# Patient Record
Sex: Female | Born: 2000 | Race: White | Hispanic: No | Marital: Single | Smoking: Never smoker
Health system: Southern US, Community
[De-identification: ages and names within clinical notes are randomized; demographics above are authoritative.]

## PROBLEM LIST (undated history)

## (undated) ENCOUNTER — Ambulatory Visit: Payer: Medicaid Other

## (undated) DIAGNOSIS — B338 Other specified viral diseases: Secondary | ICD-10-CM

## (undated) DIAGNOSIS — N390 Urinary tract infection, site not specified: Secondary | ICD-10-CM

## (undated) DIAGNOSIS — B974 Respiratory syncytial virus as the cause of diseases classified elsewhere: Secondary | ICD-10-CM

## (undated) HISTORY — PX: NO PAST SURGERIES: SHX2092

---
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
## 2022-01-12 DIAGNOSIS — Z419 Encounter for procedure for purposes other than remedying health state, unspecified: Secondary | ICD-10-CM | POA: Diagnosis not present

## 2022-01-14 DIAGNOSIS — A084 Viral intestinal infection, unspecified: Secondary | ICD-10-CM | POA: Diagnosis not present

## 2022-02-09 DIAGNOSIS — Z419 Encounter for procedure for purposes other than remedying health state, unspecified: Secondary | ICD-10-CM | POA: Diagnosis not present

## 2022-03-12 DIAGNOSIS — Z419 Encounter for procedure for purposes other than remedying health state, unspecified: Secondary | ICD-10-CM | POA: Diagnosis not present

## 2022-04-11 DIAGNOSIS — Z419 Encounter for procedure for purposes other than remedying health state, unspecified: Secondary | ICD-10-CM | POA: Diagnosis not present

## 2022-05-12 DIAGNOSIS — Z419 Encounter for procedure for purposes other than remedying health state, unspecified: Secondary | ICD-10-CM | POA: Diagnosis not present

## 2022-06-11 DIAGNOSIS — Z419 Encounter for procedure for purposes other than remedying health state, unspecified: Secondary | ICD-10-CM | POA: Diagnosis not present

## 2022-07-06 ENCOUNTER — Emergency Department: Payer: Medicaid Other

## 2022-07-06 ENCOUNTER — Emergency Department
Admission: EM | Admit: 2022-07-06 | Discharge: 2022-07-06 | Disposition: A | Discharge status: Home / Self Care | Payer: Medicaid Other | Attending: Emergency Medicine | Admitting: Emergency Medicine

## 2022-07-06 ENCOUNTER — Encounter: Payer: Self-pay | Admitting: Emergency Medicine

## 2022-07-06 DIAGNOSIS — R000 Tachycardia, unspecified: Secondary | ICD-10-CM | POA: Diagnosis not present

## 2022-07-06 DIAGNOSIS — R079 Chest pain, unspecified: Secondary | ICD-10-CM | POA: Diagnosis not present

## 2022-07-06 DIAGNOSIS — R0602 Shortness of breath: Secondary | ICD-10-CM | POA: Insufficient documentation

## 2022-07-06 DIAGNOSIS — Z8616 Personal history of COVID-19: Secondary | ICD-10-CM | POA: Diagnosis not present

## 2022-07-06 DIAGNOSIS — R002 Palpitations: Secondary | ICD-10-CM | POA: Diagnosis not present

## 2022-07-06 LAB — CBC
HCT: 36 % (ref 36.0–46.0)
Hemoglobin: 12.3 g/dL (ref 12.0–15.0)
MCH: 28.7 pg (ref 26.0–34.0)
MCHC: 34.2 g/dL (ref 30.0–36.0)
MCV: 84.1 fL (ref 80.0–100.0)
Platelets: 296 10*3/uL (ref 150–400)
RBC: 4.28 MIL/uL (ref 3.87–5.11)
RDW: 12.6 % (ref 11.5–15.5)
WBC: 12.1 10*3/uL — ABNORMAL HIGH (ref 4.0–10.5)
nRBC: 0 % (ref 0.0–0.2)

## 2022-07-06 LAB — BASIC METABOLIC PANEL
Anion gap: 5 (ref 5–15)
BUN: 11 mg/dL (ref 6–20)
CO2: 25 mmol/L (ref 22–32)
Calcium: 9.4 mg/dL (ref 8.9–10.3)
Chloride: 107 mmol/L (ref 98–111)
Creatinine, Ser: 0.96 mg/dL (ref 0.44–1.00)
GFR, Estimated: >60 mL/min (ref >60)
Glucose, Bld: 112 mg/dL — ABNORMAL HIGH (ref 70–99)
Potassium: 3.6 mmol/L (ref 3.5–5.1)
Sodium: 137 mmol/L (ref 135–145)

## 2022-07-06 LAB — TROPONIN I (HIGH SENSITIVITY)
Troponin I (High Sensitivity): <2 ng/L (ref <18)
Troponin I (High Sensitivity): <2 ng/L (ref <18)

## 2022-07-06 LAB — POC URINE PREG, ED: Preg Test, Ur: NEGATIVE

## 2022-07-06 NOTE — ED Notes (Signed)
C/O intermittent heart fluttering x 2 days.  States symptoms started while in [place] but were worse overnight.  Denies any aggravating or alleviating factors.  Denies current complaint.  AAOx3.  Skin warm and dry. NAD

## 2022-07-06 NOTE — ED Triage Notes (Signed)
Pt c/o intermittent episodes of heart palpitations x1 week with associated SOB with episodes. Pt denies cardiac hx.

## 2022-07-09 NOTE — ED Provider Notes (Signed)
[facility] Provider Note    Event Date/Time   First MD Initiated Contact with Patient 07/06/22 [phone]     (approximate)   History   Palpitations   HPI  Renee Silva is a 21 y.o. female  presents to the emergency department for treatment and evaluation of palpitations with associated shortness of breath for the past week. No chest pain. No specific trigger. Symptoms last various amounts of time. No cardiac history.  Past Medical History:  Diagnosis Date   History of 2019 novel coronavirus disease (COVID-19) 05/20/2019   RSV (respiratory syncytial virus infection)    UTI (urinary tract infection)      Physical Exam   Triage Vital Signs: ED Triage Vitals  Enc Vitals Group     BP 07/06/22 0129 115/69     Pulse Rate 07/06/22 0129 93     Resp 07/06/22 0129 16     Temp 07/06/22 0129 99.7 F (37.6 C)     Temp Source 07/06/22 0129 Oral     SpO2 07/06/22 0129 96 %     Weight --      Height --      Head Circumference --      Peak Flow --      Pain Score 07/06/22 0700 0     Pain Loc --      Pain Edu? --      Excl. in GC? --     Most recent vital signs: Vitals:   07/06/22 0353 07/06/22 0700  BP: (!) 105/54 122/73  Pulse: 84 83  Resp: 16 16  Temp: 98.5 F (36.9 C) 98 F (36.7 C)  SpO2: 100% 99%    General: Awake, no distress.  CV:  Good peripheral perfusion. Heart sounds regular. No M/R/G Resp:  Normal effort. Breath sounds clear to auscultation Abd:  No distention.  Other:     ED Results / Procedures / Treatments   Labs (all labs ordered are listed, but only abnormal results are displayed) Labs Reviewed  BASIC METABOLIC PANEL - Abnormal; Notable for the following components:      Result Value   Glucose, Bld 112 (*)    All other components within normal limits  CBC - Abnormal; Notable for the following components:   WBC 12.1 (*)    All other components within normal limits  POC URINE PREG, ED  TROPONIN I (HIGH SENSITIVITY)   TROPONIN I (HIGH SENSITIVITY)     EKG  Sinus tachycardia with a rate of 102 with occasional PVCs.   RADIOLOGY  Chest x-ray negative for acute cardiopulmonary abnormality.  I have independently reviewed and interpreted imaging as well as reviewed report from radiology.  PROCEDURES:  Critical Care performed: No  Procedures   MEDICATIONS ORDERED IN ED:  Medications - No data to display   IMPRESSION / MDM / ASSESSMENT AND PLAN / ED COURSE   I reviewed the triage vital signs and the nursing notes.  Differential diagnosis includes, but is not limited to: Cardiac dysrhythmia, anxiety, caffeine induced tachycardia  Patient's presentation is most consistent with acute illness / injury with system symptoms.  The patient is on the cardiac monitor to evaluate for evidence of arrhythmia and/or significant heart rate changes.  21 year old female presenting to the emergency department for treatment and evaluation of palpitations.  See HPI for further details.  Labs including serial troponins are all reassuring.  Chest x-ray is negative for acute concerns.  EKG does show a mild tachycardia  with occasional PVCs.  Occasional PVCs noted on bedside ECG monitoring.  Results reviewed with the patient and her mom.  Patient does consume energy drinks and some caffeine.  We discussed reducing and/or eliminating these beverages as it may be the underlying cause for the palpitations.  Plan will be to discharge her home with instructions to follow-up with cardiology especially if she feels palpitations after changing her caffeine intake.  If symptoms change or worsen or if she develops chest pain with associated symptoms she is to return to the emergency department.        FINAL CLINICAL IMPRESSION(S) / ED DIAGNOSES   Final diagnoses:  Palpitations     Rx / DC Orders   ED Discharge Orders          Ordered    Ambulatory referral to Cardiology       Comments: If you have not heard  from the Cardiology office within the next 72 hours please call [phone].   07/06/22 0734             Note:  This document was prepared using Dragon voice recognition software and may include unintentional dictation errors.   Chinita Pester, FNP 07/09/22 [phone]    Sharman Cheek, MD 07/12/22 [phone]

## 2022-07-11 ENCOUNTER — Encounter: Payer: Self-pay | Admitting: *Deleted

## 2022-07-12 DIAGNOSIS — Z419 Encounter for procedure for purposes other than remedying health state, unspecified: Secondary | ICD-10-CM | POA: Diagnosis not present

## 2022-07-18 ENCOUNTER — Ambulatory Visit (INDEPENDENT_AMBULATORY_CARE_PROVIDER_SITE_OTHER): Payer: Medicaid Other

## 2022-07-18 ENCOUNTER — Ambulatory Visit (INDEPENDENT_AMBULATORY_CARE_PROVIDER_SITE_OTHER): Payer: Medicaid Other | Admitting: Cardiology

## 2022-07-18 ENCOUNTER — Encounter: Payer: Self-pay | Admitting: Cardiology

## 2022-07-18 VITALS — BP 110/84 | HR 78 | Ht 63.0 in | Wt 204.0 lb

## 2022-07-18 DIAGNOSIS — R002 Palpitations: Secondary | ICD-10-CM | POA: Diagnosis not present

## 2022-07-18 DIAGNOSIS — Z68.41 Body mass index (BMI) pediatric, greater than or equal to 95th percentile for age: Secondary | ICD-10-CM

## 2022-07-18 DIAGNOSIS — Z6836 Body mass index (BMI) 36.0-36.9, adult: Secondary | ICD-10-CM

## 2022-07-18 NOTE — Progress Notes (Signed)
Cardiology Office Note:    Date:  07/18/2022   ID:  [name], DOB [birth year], MRN [id number]  PCP:  Linward Natal, NP [facility] Providers Cardiologist:  Debbe Odea, MD     Referring MD: Chinita Pester, FNP   Chief Complaint  Patient presents with   NEW patient-Evaluate palpitations    Symptoms began about 3 weeks ago and have improved since decreasing intake of caffienated drinks. (She was drinking energy drinks and soda daily.)    History of Present Illness:    Renee Silva is a 21 y.o. female with no significant past medical history who presents due to palpitations.  Palpitations began about 3 weeks ago.  She endorses drinking caffeinated soda and red bull occasionally.  Taken to the emergency room about 2 weeks ago due to symptoms.  Work-up in the ED was unrevealing, patient advised to cut back on caffeinated drinks and sodas.  She states symptoms have improved since cutting back on caffeine intake although she still occasionally has symptoms.  Denies any history of heart disease.     Past Medical History:  Diagnosis Date   History of 2019 novel coronavirus disease (COVID-19) 05/20/2019   RSV (respiratory syncytial virus infection)    UTI (urinary tract infection)     Past Surgical History:  Procedure Laterality Date   NO PAST SURGERIES      Current Medications: No outpatient medications have been marked as taking for the 07/18/22 encounter (Office Visit) with Debbe Odea, MD.     Allergies:   Patient has no known allergies.   Social History   Socioeconomic History   Marital status: Single    Spouse name: Not on file   Number of children: Not on file   Years of education: Not on file   Highest education level: Not on file  Occupational History   Not on file  Tobacco Use   Smoking status: Never   Smokeless tobacco: Never  Vaping Use   Vaping Use: Never used  Substance and Sexual Activity   Alcohol use: No   Drug use: No    Sexual activity: Never  Other Topics Concern   Not on file  Social History Narrative   Not on file   Social Determinants of Health   Financial Resource Strain: Not on file  Food Insecurity: Not on file  Transportation Needs: Not on file  Physical Activity: Not on file  Stress: Not on file  Social Connections: Not on file     Family History: The patient's family history includes Healthy in her father and mother.  ROS:   Please see the history of present illness.     All other systems reviewed and are negative.  EKGs/Labs/Other Studies Reviewed:    The following studies were reviewed today:   EKG:  EKG is  ordered today.  The ekg ordered today demonstrates normal sinus rhythm, low QRS voltage  Recent Labs: 07/06/2022: BUN 11; Creatinine, Ser 0.96; Hemoglobin 12.3; Platelets 296; Potassium 3.6; Sodium 137  Recent Lipid Panel No results found for: "CHOL", "TRIG", "HDL", "CHOLHDL", "VLDL", "LDLCALC", "LDLDIRECT"   Risk Assessment/Calculations:         Physical Exam:    VS:  BP 110/84 (BP Location: Right Arm, Patient Position: Sitting, Cuff Size: Large)   Pulse 78   Ht 5\' 3"  (1.6 m)   Wt 204 lb (92.5 kg)   SpO2 99%   BMI 36.14 kg/m     Wt Readings from  Last 3 Encounters:  07/18/22 204 lb (92.5 kg) (98 %, Z= 2.01)*  11/18/20 180 lb (81.6 kg) (96 %, Z= 1.72)*  06/23/20 186 lb (84.4 kg) (97 %, Z= 1.84)*   * Growth percentiles are based on CDC (Girls, 2-20 Years) data.     GEN:  Well nourished, well developed in no acute distress HEENT: Normal NECK: No JVD; No carotid bruits CARDIAC: RRR, no murmurs, rubs, gallops RESPIRATORY:  Clear to auscultation without rales, wheezing or rhonchi  ABDOMEN: Soft, non-tender, non-distended MUSCULOSKELETAL:  No edema; No deformity  SKIN: Warm and dry NEUROLOGIC:  Alert and oriented x 3 PSYCHIATRIC:  Normal affect   ASSESSMENT:    1. Palpitations   2. BMI 36.0-36.9,adult    PLAN:    In order of problems listed  above:  Palpitations, placed 2-week cardiac monitor to evaluate any significant arrhythmias.  Caffeine intake likely contributing since symptoms improved with cutting back. Overweight, low-calorie diet, weight loss advised.  Follow-up in 6 weeks     Medication Adjustments/Labs and Tests Ordered: Current medicines are reviewed at length with the patient today.  Concerns regarding medicines are outlined above.  Orders Placed This Encounter  Procedures   LONG TERM MONITOR (3-14 DAYS)   EKG 12-Lead   No orders of the defined types were placed in this encounter.   Patient Instructions  Medication Instructions:  Your physician recommends that you continue on your current medications as directed. Please refer to the Current Medication list given to you today.  *If you need a refill on your cardiac medications before your next appointment, please call your pharmacy*   Lab Work: None ordered If you have labs (blood work) drawn today and your tests are completely normal, you will receive your results only by: MyChart Message (if you have MyChart) OR A paper copy in the mail If you have any lab test that is abnormal or we need to change your treatment, we will call you to review the results.   Testing/Procedures: Your provider has ordered a heart monitor to wear for 14 days. This will be mailed to your home with instructions on placement. Once you have finished the time frame requested, you will return monitor in box provided.      Follow-Up: At [facility], you and your health needs are our priority.  As part of our continuing mission to provide you with exceptional heart care, we have created designated Provider Care Teams.  These Care Teams include your primary Cardiologist (physician) and Advanced Practice Providers (APPs -  Physician Assistants and Nurse Practitioners) who all work together to provide you with the care you need, when you need it.  We recommend signing up for  the patient portal called "MyChart".  Sign up information is provided on this After Visit Summary.  MyChart is used to connect with patients for Virtual Visits (Telemedicine).  Patients are able to view lab/test results, encounter notes, upcoming appointments, etc.  Non-urgent messages can be sent to your provider as well.   To learn more about what you can do with MyChart, go to [email or web address].    Your next appointment:   6 week(s)  The format for your next appointment:   In Person  Provider:   Debbe Odea, MD{    Other Instructions N/A  Important Information About Sugar         Signed, Debbe Odea, MD  07/18/2022 10:57 AM    [facility]

## 2022-07-18 NOTE — Patient Instructions (Signed)
Medication Instructions:  Your physician recommends that you continue on your current medications as directed. Please refer to the Current Medication list given to you today.  *If you need a refill on your cardiac medications before your next appointment, please call your pharmacy*   Lab Work: None ordered If you have labs (blood work) drawn today and your tests are completely normal, you will receive your results only by: MyChart Message (if you have MyChart) OR A paper copy in the mail If you have any lab test that is abnormal or we need to change your treatment, we will call you to review the results.   Testing/Procedures: Your provider has ordered a heart monitor to wear for 14 days. This will be mailed to your home with instructions on placement. Once you have finished the time frame requested, you will return monitor in box provided.      Follow-Up: At [facility], you and your health needs are our priority.  As part of our continuing mission to provide you with exceptional heart care, we have created designated Provider Care Teams.  These Care Teams include your primary Cardiologist (physician) and Advanced Practice Providers (APPs -  Physician Assistants and Nurse Practitioners) who all work together to provide you with the care you need, when you need it.  We recommend signing up for the patient portal called "MyChart".  Sign up information is provided on this After Visit Summary.  MyChart is used to connect with patients for Virtual Visits (Telemedicine).  Patients are able to view lab/test results, encounter notes, upcoming appointments, etc.  Non-urgent messages can be sent to your provider as well.   To learn more about what you can do with MyChart, go to [email or web address].    Your next appointment:   6 week(s)  The format for your next appointment:   In Person  Provider:   Debbe Odea, MD{    Other Instructions N/A  Important Information About  Sugar

## 2022-07-20 DIAGNOSIS — R002 Palpitations: Secondary | ICD-10-CM | POA: Diagnosis not present

## 2022-08-12 DIAGNOSIS — Z419 Encounter for procedure for purposes other than remedying health state, unspecified: Secondary | ICD-10-CM | POA: Diagnosis not present

## 2022-08-28 NOTE — Progress Notes (Unsigned)
Cardiology Office Note:    Date:  08/29/2022   ID:  Renee Silva, DOB [birth year], MRN [id number]  PCP:  Linward Natal, NP  [facility] Cardiologist:  Debbe Odea, MD  [facility] Electrophysiologist:  None   Referring MD: Linward Natal, NP   Chief Complaint: 6 week follow-up  History of Present Illness:    Renee Silva is a 21 y.o. female with a hx of palpitations being seen for follow-up.   Prior ER work-up for palpitations was unrevealing.   Seen by Dr. Azucena Cecil 07/18/22 for palpitations. A heart monitor was ordered. This showed NSR with rare ectopy, triggered events correlated to PVCs which were rare.   Today, the heart monitor was reviewed. She felt palpitations were greatly affected with high caffeine use. She used to drink multiple Red Bulls a day and soda. She has stopped drinking Soda and is drinking Red Bull occasionally. She denies any further palpitations in the last week. She denies chest pain or shortness of breath. She was going to the gym prior to the heart monitor, it is ok to go back. No lower leg edema, orthopnea or pnd. She reports daytime fatigue and tiredness. 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   Past Medical History:  Diagnosis Date   History of 2019 novel coronavirus disease (COVID-19) 05/20/2019   RSV (respiratory syncytial virus infection)    UTI (urinary tract infection)     Past Surgical History:  Procedure Laterality Date   NO PAST SURGERIES      Current Medications: No outpatient medications have been marked as taking for the 08/29/22 encounter (Office Visit) with Fransico Michael, Brysun Eschmann H, PA-C.     Allergies:   Patient has no known allergies.   Social History   Socioeconomic History   Marital status: Single    Spouse name: Not on file   Number of children: Not on file   Years of education: Not on file   Highest education level: Not on file  Occupational History   Not on file  Tobacco Use   Smoking status:  Never   Smokeless tobacco: Never  Vaping Use   Vaping Use: Never used  Substance and Sexual Activity   Alcohol use: No   Drug use: No   Sexual activity: Never  Other Topics Concern   Not on file  Social History Narrative   Not on file   Social Determinants of Health   Financial Resource Strain: Not on file  Food Insecurity: Not on file  Transportation Needs: Not on file  Physical Activity: Not on file  Stress: Not on file  Social Connections: Not on file     Family History: The patient's family history includes Healthy in her father and mother.  ROS:   Please see the history of present illness.     All other systems reviewed and are negative.  EKGs/Labs/Other Studies Reviewed:    The following studies were reviewed today:  Heart monitor 07/2022 Patient had a min HR of 49 bpm, max HR of 141 bpm, and avg HR of 83 bpm. Predominant underlying rhythm was Sinus Rhythm. Isolated SVEs were rare (<1.0%, 17), and no SVE Couplets or SVE Triplets were present. Isolated VEs were rare (<1.0%, 7672), VE  Couplets were rare (<1.0%, 1), and no VE Triplets were present. Ventricular Bigeminy and Trigeminy were present.    Conclusion Cardiac monitor with no significant arrhythmias Patient triggered events associated with PVCs which were rare.  EKG:  EKG is not ordered today.    Recent Labs: 07/06/2022: BUN 11; Creatinine, Ser 0.96; Hemoglobin 12.3; Platelets 296; Potassium 3.6; Sodium 137  Recent Lipid Panel No results found for: "CHOL", "TRIG", "HDL", "CHOLHDL", "VLDL", "LDLCALC", "LDLDIRECT"  Physical Exam:    VS:  BP 112/64 (BP Location: Left Arm, Patient Position: Sitting, Cuff Size: Large)   Pulse 89   Ht 5\' 3"  (1.6 m)   Wt 206 lb (93.4 kg)   SpO2 99%   BMI 36.49 kg/m     Wt Readings from Last 3 Encounters:  08/29/22 206 lb (93.4 kg) (98 %, Z= 2.04)*  07/18/22 204 lb (92.5 kg) (98 %, Z= 2.01)*  11/18/20 180 lb (81.6 kg) (96 %, Z= 1.72)*   * Growth percentiles are based  on CDC (Girls, 2-20 Years) data.     GEN:  Well nourished, well developed in no acute distress HEENT: Normal NECK: No JVD; No carotid bruits LYMPHATICS: No lymphadenopathy CARDIAC: RRR, no murmurs, rubs, gallops RESPIRATORY:  Clear to auscultation without rales, wheezing or rhonchi  ABDOMEN: Soft, non-tender, non-distended MUSCULOSKELETAL:  No edema; No deformity  SKIN: Warm and dry NEUROLOGIC:  Alert and oriented x 3 PSYCHIATRIC:  Normal affect   ASSESSMENT:    1. Palpitations   2. Class 2 obesity without serious comorbidity in adult, unspecified BMI, unspecified obesity type   3. Other fatigue    PLAN:    In order of problems listed above:  Palpitations Heart monitor showed NSR with rare ectopy, triggered events associated with PVCs which were rare. She feels symptoms were greatly affected by caffeine, she used to drink sodas and multiple red bulls a day. She no longer is drinking soda and is drinking Red Bull occasionally. She denies further palpitations over the last week. I will check labs for completion.   Overweight Patient stopped going to gym while wearing the heart monitor. OK to go back to the gym   Fatigue I will check BMET, Mag, TSH and CBC. Job hours may be contributing.   Disposition: Follow up in 1 year(s) with MD/APP    Signed, Tarvaris Puglia Ninfa Meeker, PA-C  08/29/2022 11:00 AM    [facility]

## 2022-08-29 ENCOUNTER — Encounter: Payer: Self-pay | Admitting: Medical

## 2022-08-29 ENCOUNTER — Ambulatory Visit: Payer: Medicaid Other | Attending: Medical | Admitting: Medical

## 2022-08-29 ENCOUNTER — Other Ambulatory Visit
Admission: RE | Admit: 2022-08-29 | Discharge: 2022-08-29 | Disposition: A | Discharge status: Home / Self Care | Payer: Medicaid Other | Source: Ambulatory Visit | Attending: Medical | Admitting: Medical

## 2022-08-29 VITALS — BP 112/64 | HR 89 | Ht 63.0 in | Wt 206.0 lb

## 2022-08-29 DIAGNOSIS — R002 Palpitations: Secondary | ICD-10-CM | POA: Diagnosis not present

## 2022-08-29 DIAGNOSIS — E669 Obesity, unspecified: Secondary | ICD-10-CM

## 2022-08-29 DIAGNOSIS — R5383 Other fatigue: Secondary | ICD-10-CM | POA: Diagnosis not present

## 2022-08-29 LAB — CBC
HCT: 37.7 % (ref 36.0–46.0)
Hemoglobin: 12.8 g/dL (ref 12.0–15.0)
MCH: 29 pg (ref 26.0–34.0)
MCHC: 34 g/dL (ref 30.0–36.0)
MCV: 85.3 fL (ref 80.0–100.0)
Platelets: 287 10*3/uL (ref 150–400)
RBC: 4.42 MIL/uL (ref 3.87–5.11)
RDW: 12.5 % (ref 11.5–15.5)
WBC: 9 10*3/uL (ref 4.0–10.5)
nRBC: 0 % (ref 0.0–0.2)

## 2022-08-29 LAB — MAGNESIUM: Magnesium: 1.9 mg/dL (ref 1.7–2.4)

## 2022-08-29 LAB — BASIC METABOLIC PANEL
Anion gap: 8 (ref 5–15)
BUN: 10 mg/dL (ref 6–20)
CO2: 24 mmol/L (ref 22–32)
Calcium: 9.3 mg/dL (ref 8.9–10.3)
Chloride: 107 mmol/L (ref 98–111)
Creatinine, Ser: 0.8 mg/dL (ref 0.44–1.00)
GFR, Estimated: >60 mL/min (ref >60)
Glucose, Bld: 137 mg/dL — ABNORMAL HIGH (ref 70–99)
Potassium: 3.8 mmol/L (ref 3.5–5.1)
Sodium: 139 mmol/L (ref 135–145)

## 2022-08-29 LAB — TSH: TSH: 1.751 u[IU]/mL (ref 0.350–4.500)

## 2022-08-29 NOTE — Patient Instructions (Signed)
Medication Instructions:   Your physician recommends that you continue on your current medications as directed. Please refer to the Current Medication list given to you today.   *If you need a refill on your cardiac medications before your next appointment, please call your pharmacy*   Lab Work:  Please go to the [facility] after your appointment today for a lab draw (BMP, Magnesium, TSH, CBC).    Follow-Up: At [facility], you and your health needs are our priority.  As part of our continuing mission to provide you with exceptional heart care, we have created designated Provider Care Teams.  These Care Teams include your primary Cardiologist (physician) and Advanced Practice Providers (APPs -  Physician Assistants and Nurse Practitioners) who all work together to provide you with the care you need, when you need it.  We recommend signing up for the patient portal called "MyChart".  Sign up information is provided on this After Visit Summary.  MyChart is used to connect with patients for Virtual Visits (Telemedicine).  Patients are able to view lab/test results, encounter notes, upcoming appointments, etc.  Non-urgent messages can be sent to your provider as well.   To learn more about what you can do with MyChart, go to [email or web address].    Your next appointment:   1 year(s)  The format for your next appointment:   In Person  Provider:   You may see Kate Sable, MD or one of the following Advanced Practice Providers on your designated Care Team:   Murray Hodgkins, NP Christell Faith, PA-C Cadence Kathlen Mody, PA-C Gerrie Nordmann, NP    Other Instructions   Important Information About Sugar

## 2022-09-11 DIAGNOSIS — Z419 Encounter for procedure for purposes other than remedying health state, unspecified: Secondary | ICD-10-CM | POA: Diagnosis not present

## 2022-10-12 DIAGNOSIS — Z419 Encounter for procedure for purposes other than remedying health state, unspecified: Secondary | ICD-10-CM | POA: Diagnosis not present

## 2022-11-11 DIAGNOSIS — Z419 Encounter for procedure for purposes other than remedying health state, unspecified: Secondary | ICD-10-CM | POA: Diagnosis not present

## 2022-11-25 DIAGNOSIS — H6091 Unspecified otitis externa, right ear: Secondary | ICD-10-CM | POA: Diagnosis not present

## 2022-12-12 DIAGNOSIS — Z419 Encounter for procedure for purposes other than remedying health state, unspecified: Secondary | ICD-10-CM | POA: Diagnosis not present

## 2023-01-12 DIAGNOSIS — Z419 Encounter for procedure for purposes other than remedying health state, unspecified: Secondary | ICD-10-CM | POA: Diagnosis not present

## 2023-01-31 DIAGNOSIS — M542 Cervicalgia: Secondary | ICD-10-CM | POA: Diagnosis not present

## 2023-02-10 DIAGNOSIS — J029 Acute pharyngitis, unspecified: Secondary | ICD-10-CM | POA: Diagnosis not present

## 2023-02-10 DIAGNOSIS — Z419 Encounter for procedure for purposes other than remedying health state, unspecified: Secondary | ICD-10-CM | POA: Diagnosis not present

## 2023-02-10 DIAGNOSIS — J111 Influenza due to unidentified influenza virus with other respiratory manifestations: Secondary | ICD-10-CM | POA: Diagnosis not present

## 2023-02-10 DIAGNOSIS — R0981 Nasal congestion: Secondary | ICD-10-CM | POA: Diagnosis not present

## 2023-02-10 DIAGNOSIS — R059 Cough, unspecified: Secondary | ICD-10-CM | POA: Diagnosis not present

## 2023-02-17 DIAGNOSIS — Z1330 Encounter for screening examination for mental health and behavioral disorders, unspecified: Secondary | ICD-10-CM | POA: Diagnosis not present

## 2023-02-17 DIAGNOSIS — N946 Dysmenorrhea, unspecified: Secondary | ICD-10-CM | POA: Diagnosis not present

## 2023-02-17 DIAGNOSIS — Z68.41 Body mass index (BMI) pediatric, greater than or equal to 95th percentile for age: Secondary | ICD-10-CM | POA: Diagnosis not present

## 2023-02-17 DIAGNOSIS — Z713 Dietary counseling and surveillance: Secondary | ICD-10-CM | POA: Diagnosis not present

## 2023-02-17 DIAGNOSIS — Z0001 Encounter for general adult medical examination with abnormal findings: Secondary | ICD-10-CM | POA: Diagnosis not present

## 2023-03-13 DIAGNOSIS — Z419 Encounter for procedure for purposes other than remedying health state, unspecified: Secondary | ICD-10-CM | POA: Diagnosis not present

## 2023-04-12 DIAGNOSIS — Z419 Encounter for procedure for purposes other than remedying health state, unspecified: Secondary | ICD-10-CM | POA: Diagnosis not present

## 2023-05-13 DIAGNOSIS — Z419 Encounter for procedure for purposes other than remedying health state, unspecified: Secondary | ICD-10-CM | POA: Diagnosis not present

## 2023-06-12 DIAGNOSIS — Z419 Encounter for procedure for purposes other than remedying health state, unspecified: Secondary | ICD-10-CM | POA: Diagnosis not present

## 2023-07-13 DIAGNOSIS — Z419 Encounter for procedure for purposes other than remedying health state, unspecified: Secondary | ICD-10-CM | POA: Diagnosis not present

## 2023-08-03 ENCOUNTER — Ambulatory Visit (INDEPENDENT_AMBULATORY_CARE_PROVIDER_SITE_OTHER): Payer: Medicaid Other

## 2023-08-03 ENCOUNTER — Ambulatory Visit
Admission: EM | Admit: 2023-08-03 | Discharge: 2023-08-03 | Disposition: A | Discharge status: Home / Self Care | Payer: Medicaid Other | Attending: Internal Medicine | Admitting: Internal Medicine

## 2023-08-03 DIAGNOSIS — K5901 Slow transit constipation: Secondary | ICD-10-CM

## 2023-08-03 DIAGNOSIS — K5900 Constipation, unspecified: Secondary | ICD-10-CM | POA: Diagnosis not present

## 2023-08-03 DIAGNOSIS — R109 Unspecified abdominal pain: Secondary | ICD-10-CM | POA: Diagnosis not present

## 2023-08-03 MED ORDER — ONDANSETRON 4 MG PO TBDP: 4.0000 mg | ORAL_TABLET | Freq: Three times a day (TID) | ORAL | 0 refills | Status: DC | PRN

## 2023-08-03 MED ORDER — POLYETHYLENE GLYCOL 3350 17 G PO PACK: 17.0000 g | PACK | Freq: Every day | ORAL | 0 refills | Status: DC | PRN

## 2023-08-03 MED ORDER — SENNOSIDES-DOCUSATE SODIUM 8.6-50 MG PO TABS: 1.0000 | ORAL_TABLET | Freq: Two times a day (BID) | ORAL | 0 refills | Status: DC

## 2023-08-03 NOTE — ED Provider Notes (Signed)
[facility]    CSN: [id number] Arrival date & time: 08/03/23  4098      History   Chief Complaint Chief Complaint  Patient presents with   Emesis   Constipation    HPI Renee Silva is a 22 y.o. female comes to the urgent care with a 2-week history of constipation, lower abdominal pain and 1 day history of vomiting.  Patient has a remote history of constipation which was previously intermittent.  Over the past couple of weeks the patient has noticed worsening constipation.  She endorses passing hard stool.  She is also developed intermittent cramping abdominal pain associated with an urge to have a bowel movement.  There is no known relieving factors.  Pain is mild to moderate in intensity when it happens and subsides subsequently.  No fever or chills.  No dysuria urgency or frequency.  Patient endorses irregular menses and cold intolerance but denies any significant weight change.  She is not sexually active.  Patient drinks a lot of water but agrees that her fruit and vegetable intake could be better.  HPI  Past Medical History:  Diagnosis Date   History of 2019 novel coronavirus disease (COVID-19) 05/20/2019   RSV (respiratory syncytial virus infection)    UTI (urinary tract infection)     There are no problems to display for this patient.   Past Surgical History:  Procedure Laterality Date   NO PAST SURGERIES      OB History   No obstetric history on file.      Home Medications    Prior to Admission medications   Medication Sig Start Date End Date Taking? Authorizing Provider  ondansetron (ZOFRAN-ODT) 4 MG disintegrating tablet Take 1 tablet (4 mg total) by mouth every 8 (eight) hours as needed for nausea or vomiting. 08/03/23  Yes Anjannette Gauger, Britta Mccreedy, MD  polyethylene glycol (MIRALAX) 17 g packet Take 17 g by mouth daily as needed for moderate constipation or mild constipation. 08/03/23  Yes Carmelia Tiner, Britta Mccreedy, MD  senna-docusate (SENOKOT-S) 8.6-50 MG  tablet Take 1 tablet by mouth 2 (two) times daily. 08/03/23  Yes Ersel Enslin, Britta Mccreedy, MD  omeprazole (PRILOSEC) 20 MG capsule Take 1 capsule (20 mg total) by mouth daily. 06/23/20 11/18/20  Tommie Sams, DO    Family History Family History  Problem Relation Age of Onset   Healthy Mother    Healthy Father     Social History Social History   Tobacco Use   Smoking status: Never   Smokeless tobacco: Never  Vaping Use   Vaping status: Never Used  Substance Use Topics   Alcohol use: No   Drug use: No     Allergies   Patient has no known allergies.   Review of Systems Review of Systems As per HPI  Physical Exam Triage Vital Signs ED Triage Vitals  Encounter Vitals Group     BP 08/03/23 0829 104/61     Systolic BP Percentile --      Diastolic BP Percentile --      Pulse Rate 08/03/23 0829 68     Resp 08/03/23 0829 16     Temp 08/03/23 0829 98.1 F (36.7 C)     Temp Source 08/03/23 0829 Oral     SpO2 08/03/23 0829 98 %     Weight --      Height --      Head Circumference --      Peak Flow --  Pain Score 08/03/23 0828 6     Pain Loc --      Pain Education --      Exclude from Growth Chart --    No data found.  Updated Vital Signs BP 104/61 (BP Location: Left Arm)   Pulse 68   Temp 98.1 F (36.7 C) (Oral)   Resp 16   LMP 07/06/2023 (Approximate) Comment: denies preg, signed waiver  SpO2 98%   Visual Acuity Right Eye Distance:   Left Eye Distance:   Bilateral Distance:    Right Eye Near:   Left Eye Near:    Bilateral Near:     Physical Exam Vitals and nursing note reviewed.  Constitutional:      General: She is in acute distress.     Appearance: She is not ill-appearing.  HENT:     Mouth/Throat:     Pharynx: No oropharyngeal exudate.  Cardiovascular:     Rate and Rhythm: Normal rate and regular rhythm.  Pulmonary:     Effort: Pulmonary effort is normal.     Breath sounds: Normal breath sounds.  Abdominal:     General: Bowel sounds are  normal.     Palpations: Abdomen is soft.  Musculoskeletal:        General: Normal range of motion.  Skin:    General: Skin is warm.     Capillary Refill: Capillary refill takes less than 2 seconds.     Coloration: Skin is not pale.     Findings: No erythema.  Neurological:     Mental Status: She is alert.      UC Treatments / Results  Labs (all labs ordered are listed, but only abnormal results are displayed) Labs Reviewed  TSH    EKG   Radiology No results found.  Procedures Procedures (including critical care time)  Medications Ordered in UC Medications - No data to display  Initial Impression / Assessment and Plan / UC Course  I have reviewed the triage vital signs and the nursing notes.  Pertinent labs & imaging results that were available during my care of the patient were reviewed by me and considered in my medical decision making (see chart for details).     1.  Abdominal pain with constipation: KUB shows moderate colonic stool burden. MiraLAX 17 g daily until patient has a good bowel movement Zofran ODT 4 mg every 6-8 hours as needed for nausea/vomiting Senokot twice daily Patient is advised to maintain adequate hydration Increase fruit and vegetable intake TSH to evaluate for hypothyroidism Return precautions given Final Clinical Impressions(s) / UC Diagnoses   Final diagnoses:  Slow transit constipation     Discharge Instructions      Resolved fluid intake Increase vegetable, fiber and fruit intake Take medications as prescribed  X-ray of your abdomen shows significant amount of stool in your colon If you have worsening symptoms please return to urgent care to be reevaluated Will call you with recommendations if labs are abnormal.     ED Prescriptions     Medication Sig Dispense Auth. Provider   polyethylene glycol (MIRALAX) 17 g packet Take 17 g by mouth daily as needed for moderate constipation or mild constipation. 14 each Eaton Folmar,  Britta Mccreedy, MD   senna-docusate (SENOKOT-S) 8.6-50 MG tablet Take 1 tablet by mouth 2 (two) times daily. 30 tablet Anastasios Melander, Britta Mccreedy, MD   ondansetron (ZOFRAN-ODT) 4 MG disintegrating tablet Take 1 tablet (4 mg total) by mouth every 8 (eight) hours as needed  for nausea or vomiting. 15 tablet Zailee Vallely, Britta Mccreedy, MD      PDMP not reviewed this encounter.   Merrilee Jansky, MD 08/03/23 [phone]

## 2023-08-03 NOTE — ED Triage Notes (Signed)
Patient presents to UC for constipation, intermittent lower abdominal pain x 2 weeks. States she has a hx of constipation. Treating with OTC fiber supplement, enema, and drinking prune juice with no relief. Also reports emesis x 3 days. States these episodes of vomiting occur in the evenings.   Denies fever.

## 2023-08-03 NOTE — Discharge Instructions (Addendum)
Increased oral fluid intake Increase vegetable, fiber and fruit intake Take medications as prescribed  X-ray of your abdomen shows significant amount of stool in your colon If you have worsening symptoms please return to urgent care to be reevaluated Will call you with recommendations if labs are abnormal.

## 2023-08-13 DIAGNOSIS — Z419 Encounter for procedure for purposes other than remedying health state, unspecified: Secondary | ICD-10-CM | POA: Diagnosis not present

## 2023-09-12 DIAGNOSIS — A084 Viral intestinal infection, unspecified: Secondary | ICD-10-CM | POA: Diagnosis not present

## 2023-09-12 DIAGNOSIS — Z419 Encounter for procedure for purposes other than remedying health state, unspecified: Secondary | ICD-10-CM | POA: Diagnosis not present

## 2023-10-13 DIAGNOSIS — Z419 Encounter for procedure for purposes other than remedying health state, unspecified: Secondary | ICD-10-CM | POA: Diagnosis not present

## 2023-11-07 ENCOUNTER — Ambulatory Visit
Admission: RE | Admit: 2023-11-07 | Discharge: 2023-11-07 | Disposition: A | Discharge status: Home / Self Care | Payer: Medicaid Other | Source: Ambulatory Visit | Attending: Emergency Medicine | Admitting: Emergency Medicine

## 2023-11-07 VITALS — BP 103/71 | HR 88 | Temp 98.2°F | Resp 18

## 2023-11-07 DIAGNOSIS — J069 Acute upper respiratory infection, unspecified: Secondary | ICD-10-CM | POA: Diagnosis not present

## 2023-11-07 MED ORDER — AMOXICILLIN-POT CLAVULANATE 875-125 MG PO TABS: 1.0000 | ORAL_TABLET | Freq: Two times a day (BID) | ORAL | 0 refills | Status: AC

## 2023-11-07 NOTE — ED Provider Notes (Signed)
[facility]    CSN: [id number] Arrival date & time: 11/07/23  1729      History   Chief Complaint Chief Complaint  Patient presents with   Cough    HPI Renee Silva is a 22 y.o. female.   Patient presents for evaluation of a worsening cough over the last 2 weeks, has become more prominent over the last 3 to 4 days has become productive and interfering with sleep.  Endorses that cough never fully resolved from a viral illness 2 months ago.  Associated wheezing predominantly in the morning when cough is first initiated upon awakening.  Decreased appetite due to nausea but able to tolerate some food and liquids.  Has been using over-the-counter cough suppressant which has been helpful but has not resolved symptoms.  Shortness of breath, chest pain or tightness.  Past Medical History:  Diagnosis Date   History of 2019 novel coronavirus disease (COVID-19) 05/20/2019   RSV (respiratory syncytial virus infection)    UTI (urinary tract infection)     There are no problems to display for this patient.   Past Surgical History:  Procedure Laterality Date   NO PAST SURGERIES      OB History   No obstetric history on file.      Home Medications    Prior to Admission medications   Medication Sig Start Date End Date Taking? Authorizing Provider  amoxicillin-clavulanate (AUGMENTIN) 875-125 MG tablet Take 1 tablet by mouth every 12 (twelve) hours for 10 days. 11/07/23 11/17/23 Yes Neithan Day R, NP  ondansetron (ZOFRAN-ODT) 4 MG disintegrating tablet Take 1 tablet (4 mg total) by mouth every 8 (eight) hours as needed for nausea or vomiting. 08/03/23   Lamptey, Britta Mccreedy, MD  polyethylene glycol (MIRALAX) 17 g packet Take 17 g by mouth daily as needed for moderate constipation or mild constipation. 08/03/23   Lamptey, Britta Mccreedy, MD  senna-docusate (SENOKOT-S) 8.6-50 MG tablet Take 1 tablet by mouth 2 (two) times daily. 08/03/23   Merrilee Jansky, MD  omeprazole (PRILOSEC)  20 MG capsule Take 1 capsule (20 mg total) by mouth daily. 06/23/20 11/18/20  Tommie Sams, DO    Family History Family History  Problem Relation Age of Onset   Healthy Mother    Healthy Father     Social History Social History   Tobacco Use   Smoking status: Never   Smokeless tobacco: Never  Vaping Use   Vaping status: Never Used  Substance Use Topics   Alcohol use: No   Drug use: No     Allergies   Patient has no known allergies.   Review of Systems Review of Systems  Respiratory:  Positive for cough.      Physical Exam Triage Vital Signs ED Triage Vitals  Encounter Vitals Group     BP 11/07/23 1743 103/71     Systolic BP Percentile --      Diastolic BP Percentile --      Pulse Rate 11/07/23 1743 88     Resp 11/07/23 1743 18     Temp 11/07/23 1743 98.2 F (36.8 C)     Temp src --      SpO2 11/07/23 1743 98 %     Weight --      Height --      Head Circumference --      Peak Flow --      Pain Score 11/07/23 1750 0     Pain Loc --  Pain Education --      Exclude from Growth Chart --    No data found.  Updated Vital Signs BP 103/71   Pulse 88   Temp 98.2 F (36.8 C)   Resp 18   SpO2 98%   Visual Acuity Right Eye Distance:   Left Eye Distance:   Bilateral Distance:    Right Eye Near:   Left Eye Near:    Bilateral Near:     Physical Exam Constitutional:      Appearance: Normal appearance.  HENT:     Head: Normocephalic.     Right Ear: Tympanic membrane, ear canal and external ear normal.     Left Ear: Tympanic membrane, ear canal and external ear normal.     Nose: Nose normal.     Mouth/Throat:     Mouth: Mucous membranes are moist.     Pharynx: Oropharynx is clear.  Eyes:     Extraocular Movements: Extraocular movements intact.  Cardiovascular:     Rate and Rhythm: Normal rate and regular rhythm.     Pulses: Normal pulses.     Heart sounds: Normal heart sounds.  Pulmonary:     Effort: Pulmonary effort is normal.     Breath  sounds: Normal breath sounds.  Neurological:     Mental Status: She is alert and oriented to person, place, and time. Mental status is at baseline.      UC Treatments / Results  Labs (all labs ordered are listed, but only abnormal results are displayed) Labs Reviewed - No data to display  EKG   Radiology No results found.  Procedures Procedures (including critical care time)  Medications Ordered in UC Medications - No data to display  Initial Impression / Assessment and Plan / UC Course  I have reviewed the triage vital signs and the nursing notes.  Pertinent labs & imaging results that were available during my care of the patient were reviewed by me and considered in my medical decision making (see chart for details).  Acute URI  Patient is in no signs of distress nor toxic appearing.  Vital signs are stable.  symptoms present for 2 months however as long as are clear will defer chest x-ray, low suspicion for pneumonia or bronchitis.  Prescribed Augmentin.  Declined prescription for cough medicine.May use additional over-the-counter medications as needed for supportive care.  May follow-up with urgent care as needed if symptoms persist or worsen.   Final Clinical Impressions(s) / UC Diagnoses   Final diagnoses:  Acute URI     Discharge Instructions      Begin Augmentin every morning and every evening for 10 days for coverage for bacteria to the upper respiratory system    You can take Tylenol and/or Ibuprofen as needed for fever reduction and pain relief.   For cough: honey 1/2 to 1 teaspoon (you can dilute the honey in water or another fluid).  You can also use guaifenesin and dextromethorphan for cough. You can use a humidifier for chest congestion and cough.  If you don't have a humidifier, you can sit in the bathroom with the hot shower running.      For sore throat: try warm salt water gargles, cepacol lozenges, throat spray, warm tea or water with lemon/honey,  popsicles or ice, or OTC cold relief medicine for throat discomfort.   For congestion: take a daily anti-histamine like Zyrtec, Claritin, and a oral decongestant, such as pseudoephedrine.  You can also use Flonase 1-2 sprays  in each nostril daily.   It is important to stay hydrated: drink plenty of fluids (water, gatorade/powerade/pedialyte, juices, or teas) to keep your throat moisturized and help further relieve irritation/discomfort.    ED Prescriptions     Medication Sig Dispense Auth. Provider   amoxicillin-clavulanate (AUGMENTIN) 875-125 MG tablet Take 1 tablet by mouth every 12 (twelve) hours for 10 days. 20 tablet Valinda Hoar, NP      PDMP not reviewed this encounter.   Valinda Hoar, NP 11/07/23 [phone]

## 2023-11-07 NOTE — Discharge Instructions (Signed)
Begin Augmentin every morning and every evening for 10 days for coverage for bacteria to the upper respiratory system    You can take Tylenol and/or Ibuprofen as needed for fever reduction and pain relief.   For cough: honey 1/2 to 1 teaspoon (you can dilute the honey in water or another fluid).  You can also use guaifenesin and dextromethorphan for cough. You can use a humidifier for chest congestion and cough.  If you don't have a humidifier, you can sit in the bathroom with the hot shower running.      For sore throat: try warm salt water gargles, cepacol lozenges, throat spray, warm tea or water with lemon/honey, popsicles or ice, or OTC cold relief medicine for throat discomfort.   For congestion: take a daily anti-histamine like Zyrtec, Claritin, and a oral decongestant, such as pseudoephedrine.  You can also use Flonase 1-2 sprays in each nostril daily.   It is important to stay hydrated: drink plenty of fluids (water, gatorade/powerade/pedialyte, juices, or teas) to keep your throat moisturized and help further relieve irritation/discomfort.

## 2023-11-07 NOTE — ED Triage Notes (Signed)
Patient to Urgent Care with complaints of cough/ fatigue.  Reports that her symptoms started two weeks ago.

## 2023-11-12 DIAGNOSIS — Z419 Encounter for procedure for purposes other than remedying health state, unspecified: Secondary | ICD-10-CM | POA: Diagnosis not present

## 2023-12-13 DIAGNOSIS — Z419 Encounter for procedure for purposes other than remedying health state, unspecified: Secondary | ICD-10-CM | POA: Diagnosis not present

## 2024-01-13 DIAGNOSIS — Z419 Encounter for procedure for purposes other than remedying health state, unspecified: Secondary | ICD-10-CM | POA: Diagnosis not present

## 2024-01-15 ENCOUNTER — Ambulatory Visit
Admission: EM | Admit: 2024-01-15 | Discharge: 2024-01-15 | Payer: Medicaid Other | Attending: Family Medicine | Admitting: Family Medicine

## 2024-01-15 ENCOUNTER — Encounter: Payer: Self-pay | Admitting: Emergency Medicine

## 2024-01-15 ENCOUNTER — Emergency Department
Admission: EM | Admit: 2024-01-15 | Discharge: 2024-01-15 | Disposition: A | Discharge status: Home / Self Care | Payer: Medicaid Other | Attending: Emergency Medicine | Admitting: Emergency Medicine

## 2024-01-15 ENCOUNTER — Other Ambulatory Visit: Payer: Self-pay

## 2024-01-15 ENCOUNTER — Emergency Department: Payer: Medicaid Other

## 2024-01-15 DIAGNOSIS — B349 Viral infection, unspecified: Secondary | ICD-10-CM | POA: Insufficient documentation

## 2024-01-15 DIAGNOSIS — M542 Cervicalgia: Secondary | ICD-10-CM | POA: Diagnosis not present

## 2024-01-15 DIAGNOSIS — S0990XA Unspecified injury of head, initial encounter: Secondary | ICD-10-CM

## 2024-01-15 DIAGNOSIS — J111 Influenza due to unidentified influenza virus with other respiratory manifestations: Secondary | ICD-10-CM

## 2024-01-15 DIAGNOSIS — Z20822 Contact with and (suspected) exposure to covid-19: Secondary | ICD-10-CM | POA: Insufficient documentation

## 2024-01-15 DIAGNOSIS — S060X0A Concussion without loss of consciousness, initial encounter: Secondary | ICD-10-CM | POA: Insufficient documentation

## 2024-01-15 DIAGNOSIS — R509 Fever, unspecified: Secondary | ICD-10-CM | POA: Diagnosis not present

## 2024-01-15 DIAGNOSIS — R221 Localized swelling, mass and lump, neck: Secondary | ICD-10-CM | POA: Diagnosis not present

## 2024-01-15 DIAGNOSIS — R519 Headache, unspecified: Secondary | ICD-10-CM

## 2024-01-15 DIAGNOSIS — Y9369 Activity, other involving other sports and athletics played as a team or group: Secondary | ICD-10-CM | POA: Diagnosis not present

## 2024-01-15 DIAGNOSIS — S199XXA Unspecified injury of neck, initial encounter: Secondary | ICD-10-CM | POA: Diagnosis not present

## 2024-01-15 LAB — RESP PANEL BY RT-PCR (RSV, FLU A&B, COVID)  RVPGX2
Influenza A by PCR: NEGATIVE
Influenza B by PCR: NEGATIVE
Resp Syncytial Virus by PCR: NEGATIVE
SARS Coronavirus 2 by RT PCR: NEGATIVE

## 2024-01-15 MED ORDER — ACETAMINOPHEN 500 MG PO TABS
1000.0000 mg | ORAL_TABLET | Freq: Once | ORAL | Status: AC
Start: 2024-01-15 — End: 2024-01-15
  Administered 2024-01-15: 1000 mg via ORAL
  Filled 2024-01-15: qty 2

## 2024-01-15 MED ORDER — KETOROLAC TROMETHAMINE 30 MG/ML IJ SOLN
30.0000 mg | Freq: Once | INTRAMUSCULAR | Status: AC
  Administered 2024-01-15: 30 mg via INTRAMUSCULAR
  Filled 2024-01-15: qty 1

## 2024-01-15 NOTE — Discharge Instructions (Addendum)
Please take Tylenol and ibuprofen/Advil for your pain.  It is safe to take them together, or to alternate them every few hours.  Take up to 1000mg of Tylenol at a time, up to 4 times per day.  Do not take more than 4000 mg of Tylenol in 24 hours.  For ibuprofen, take 400-600 mg, 3 - 4 times per day.  

## 2024-01-15 NOTE — Discharge Instructions (Addendum)
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
## 2024-01-15 NOTE — ED Notes (Signed)
 ..  The patient is A&OX4, ambulatory at d/c with independent steady gait, NAD. Pt verbalized understanding of d/c instructions and follow up care.
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
## 2024-01-17 DIAGNOSIS — Z113 Encounter for screening for infections with a predominantly sexual mode of transmission: Secondary | ICD-10-CM | POA: Diagnosis not present

## 2024-01-17 DIAGNOSIS — Z13228 Encounter for screening for other metabolic disorders: Secondary | ICD-10-CM | POA: Diagnosis not present

## 2024-01-17 DIAGNOSIS — R591 Generalized enlarged lymph nodes: Secondary | ICD-10-CM | POA: Diagnosis not present

## 2024-01-17 DIAGNOSIS — S060X0A Concussion without loss of consciousness, initial encounter: Secondary | ICD-10-CM | POA: Diagnosis not present

## 2024-01-17 DIAGNOSIS — R519 Headache, unspecified: Secondary | ICD-10-CM | POA: Diagnosis not present

## 2024-01-17 DIAGNOSIS — R509 Fever, unspecified: Secondary | ICD-10-CM | POA: Diagnosis not present

## 2024-01-17 DIAGNOSIS — B349 Viral infection, unspecified: Secondary | ICD-10-CM | POA: Diagnosis not present

## 2024-02-02 DIAGNOSIS — B2790 Infectious mononucleosis, unspecified without complication: Secondary | ICD-10-CM | POA: Diagnosis not present

## 2024-02-02 DIAGNOSIS — J309 Allergic rhinitis, unspecified: Secondary | ICD-10-CM | POA: Diagnosis not present

## 2024-02-10 DIAGNOSIS — Z419 Encounter for procedure for purposes other than remedying health state, unspecified: Secondary | ICD-10-CM | POA: Diagnosis not present

## 2024-03-23 DIAGNOSIS — Z419 Encounter for procedure for purposes other than remedying health state, unspecified: Secondary | ICD-10-CM | POA: Diagnosis not present

## 2024-04-03 ENCOUNTER — Encounter

## 2024-04-14 DIAGNOSIS — Z3A01 Less than 8 weeks gestation of pregnancy: Secondary | ICD-10-CM | POA: Diagnosis not present

## 2024-04-14 DIAGNOSIS — N390 Urinary tract infection, site not specified: Secondary | ICD-10-CM | POA: Diagnosis not present

## 2024-04-14 DIAGNOSIS — O209 Hemorrhage in early pregnancy, unspecified: Secondary | ICD-10-CM | POA: Diagnosis not present

## 2024-04-14 DIAGNOSIS — O2341 Unspecified infection of urinary tract in pregnancy, first trimester: Secondary | ICD-10-CM | POA: Diagnosis not present

## 2024-04-14 DIAGNOSIS — Z3A00 Weeks of gestation of pregnancy not specified: Secondary | ICD-10-CM | POA: Diagnosis not present

## 2024-04-14 DIAGNOSIS — O200 Threatened abortion: Secondary | ICD-10-CM | POA: Diagnosis not present

## 2024-04-16 DIAGNOSIS — O034 Incomplete spontaneous abortion without complication: Secondary | ICD-10-CM | POA: Diagnosis not present

## 2024-04-22 DIAGNOSIS — Z419 Encounter for procedure for purposes other than remedying health state, unspecified: Secondary | ICD-10-CM | POA: Diagnosis not present

## 2024-04-23 ENCOUNTER — Ambulatory Visit: Admission: EM | Admit: 2024-04-23 | Discharge: 2024-04-23 | Disposition: A | Discharge status: Home / Self Care

## 2024-04-23 DIAGNOSIS — H6991 Unspecified Eustachian tube disorder, right ear: Secondary | ICD-10-CM

## 2024-04-23 MED ORDER — METHYLPREDNISOLONE 4 MG PO TBPK: ORAL_TABLET | ORAL | 0 refills | Status: DC

## 2024-04-23 MED ORDER — FLUTICASONE PROPIONATE 50 MCG/ACT NA SUSP: 2.0000 | Freq: Every day | NASAL | 1 refills | Status: DC

## 2024-04-23 NOTE — Discharge Instructions (Addendum)
 Start the Medrol Dosepak in the morning and take it according to the package instructions.  Take over-the-counter Zyrtec, Claritin, or Allegra once daily to help with allergic symptoms.  Instill 2 squirts of fluticasone in each nostril at bedtime nightly.  And the nasal away from the septum of your nose and follow each set of squirts with 1 squirt of nasal saline to push the particles up into your turbinates where they will take effect.  Put a heating pad underneath your pillowcase and turned on low.  The heat can help dilate up eustachian tube and return patency and help with pain.  Continue to equalize your ears as shown to help clear mucus from eustachian tubes and maintain patency.

## 2024-04-23 NOTE — ED Triage Notes (Signed)
Right ear pain that started this morning.

## 2024-04-23 NOTE — ED Provider Notes (Signed)
 [facility]    CSN: [id number] Arrival date & time: 04/23/24  1748      History   Chief Complaint Chief Complaint  Patient presents with   Otalgia    HPI CONLEE GODOY is a 23 y.o. female.   HPI  23 year old female with past medical history significant for RSV and UTI presents for evaluation of right ear pain that started this morning.  She denies any drainage from the ear, changes to her hearing, or ringing in her ear.  She also denies any runny nose or nasal congestion.  Past Medical History:  Diagnosis Date   History of 2019 novel coronavirus disease (COVID-19) 05/20/2019   RSV (respiratory syncytial virus infection)    UTI (urinary tract infection)     There are no active problems to display for this patient.   Past Surgical History:  Procedure Laterality Date   NO PAST SURGERIES      OB History   No obstetric history on file.      Home Medications    Prior to Admission medications   Medication Sig Start Date End Date Taking? Authorizing Provider  cephALEXin (KEFLEX) 500 MG capsule Take 500 mg by mouth every 6 (six) hours. 04/15/24  Yes [provider]  fluticasone (FLONASE) 50 MCG/ACT nasal spray Place 2 sprays into both nostrils daily. 04/23/24  Yes Kent Pear, NP  methylPREDNISolone (MEDROL DOSEPAK) 4 MG TBPK tablet Take according to the package insert. 04/23/24  Yes Kent Pear, NP  ondansetron  (ZOFRAN -ODT) 4 MG disintegrating tablet Take 1 tablet (4 mg total) by mouth every 8 (eight) hours as needed for nausea or vomiting. 08/03/23   Lamptey, Donley Furth, MD  polyethylene glycol (MIRALAX ) 17 g packet Take 17 g by mouth daily as needed for moderate constipation or mild constipation. 08/03/23   Lamptey, Donley Furth, MD  senna-docusate (SENOKOT-S) 8.6-50 MG tablet Take 1 tablet by mouth 2 (two) times daily. 08/03/23   LampteyDonley Furth, MD  omeprazole  (PRILOSEC) 20 MG capsule Take 1 capsule (20 mg total) by mouth daily. 06/23/20 11/18/20  Cook,  Jayce G, DO    Family History Family History  Problem Relation Age of Onset   Healthy Mother    Healthy Father     Social History Social History   Tobacco Use   Smoking status: Never   Smokeless tobacco: Never  Vaping Use   Vaping status: Never Used  Substance Use Topics   Alcohol use: No   Drug use: No     Allergies   Patient has no known allergies.   Review of Systems Review of Systems  Constitutional:  Negative for fever.  HENT:  Positive for ear pain. Negative for congestion, ear discharge, hearing loss, rhinorrhea and tinnitus.      Physical Exam Triage Vital Signs ED Triage Vitals  Encounter Vitals Group     BP      Systolic BP Percentile      Diastolic BP Percentile      Pulse      Resp      Temp      Temp src      SpO2      Weight      Height      Head Circumference      Peak Flow      Pain Score      Pain Loc      Pain Education      Exclude from Growth Chart  No data found.  Updated Vital Signs BP 103/69 (BP Location: Right Arm)   Pulse 70   Temp 99.1 F (37.3 C) (Oral)   Resp 15   SpO2 98%   Visual Acuity Right Eye Distance:   Left Eye Distance:   Bilateral Distance:    Right Eye Near:   Left Eye Near:    Bilateral Near:     Physical Exam Vitals and nursing note reviewed.  Constitutional:      Appearance: Normal appearance. She is not ill-appearing.  HENT:     Head: Normocephalic and atraumatic.     Right Ear: Tympanic membrane, ear canal and external ear normal. There is no impacted cerumen.     Left Ear: Tympanic membrane, ear canal and external ear normal. There is no impacted cerumen.     Ears:     Comments: Patient does have tenderness with external palpation of the right eustachian tube. Skin:    General: Skin is warm and dry.     Capillary Refill: Capillary refill takes less than 2 seconds.     Findings: No rash.  Neurological:     General: No focal deficit present.     Mental Status: She is alert and  oriented to person, place, and time.      UC Treatments / Results  Labs (all labs ordered are listed, but only abnormal results are displayed) Labs Reviewed - No data to display  EKG   Radiology No results found.  Procedures Procedures (including critical care time)  Medications Ordered in UC Medications - No data to display  Initial Impression / Assessment and Plan / UC Course  I have reviewed the triage vital signs and the nursing notes.  Pertinent labs & imaging results that were available during my care of the patient were reviewed by me and considered in my medical decision making (see chart for details).   Patient is a nontoxic-appearing 23 year old female presenting for evaluation of acute onset right ear pain as outlined HPI above.  On exam patient has pearly gray tympanic membranes bilaterally with normal light reflex and clear external auditory canals.  She does have some tenderness with external palpation of the right eustachian tube.  I did have the patient attempt to pop her ears in the exam room and she was unable to clear the right ear leading me to believe that she has some eustachian tube dysfunction.  I will discharge her home on a Medrol Dosepak to start tomorrow morning to decrease inflammation in her ear and help return patency to her eustachian tube along with Flonase nasal spray that she can do to squirts up each nostril at bedtime.  I have advised her to put a heating pad underneath her pillowcase and turn on both night when she goes to bed and let them heat help dilate the eustachian tube and possibly return patency.  She should also periodically throughout the day and attempt to pop her ears to clear the eustachian blockage.  Return precautions reviewed.   Final Clinical Impressions(s) / UC Diagnoses   Final diagnoses:  Dysfunction of right eustachian tube     Discharge Instructions      Start the Medrol Dosepak in the morning and take it according to  the package instructions.  Take over-the-counter Zyrtec, Claritin, or Allegra once daily to help with allergic symptoms.  Instill 2 squirts of fluticasone in each nostril at bedtime nightly.  And the nasal away from the septum of your nose and  follow each set of squirts with 1 squirt of nasal saline to push the particles up into your turbinates where they will take effect.  Put a heating pad underneath your pillowcase and turned on low.  The heat can help dilate up eustachian tube and return patency and help with pain.  Continue to equalize your ears as shown to help clear mucus from eustachian tubes and maintain patency.     ED Prescriptions     Medication Sig Dispense Auth. Provider   methylPREDNISolone (MEDROL DOSEPAK) 4 MG TBPK tablet Take according to the package insert. 1 each Kent Pear, NP   fluticasone (FLONASE) 50 MCG/ACT nasal spray Place 2 sprays into both nostrils daily. 18.2 mL Kent Pear, NP      PDMP not reviewed this encounter.   Kent Pear, NP 04/23/24 1819

## 2024-05-16 ENCOUNTER — Encounter: Payer: Self-pay | Admitting: Cardiology

## 2024-05-23 DIAGNOSIS — Z419 Encounter for procedure for purposes other than remedying health state, unspecified: Secondary | ICD-10-CM | POA: Diagnosis not present

## 2024-06-09 ENCOUNTER — Encounter: Payer: Self-pay | Admitting: Emergency Medicine

## 2024-06-09 ENCOUNTER — Ambulatory Visit
Admission: EM | Admit: 2024-06-09 | Discharge: 2024-06-09 | Disposition: A | Discharge status: Home / Self Care | Attending: Emergency Medicine | Admitting: Emergency Medicine

## 2024-06-09 DIAGNOSIS — Z113 Encounter for screening for infections with a predominantly sexual mode of transmission: Secondary | ICD-10-CM | POA: Insufficient documentation

## 2024-06-09 DIAGNOSIS — Z87440 Personal history of urinary (tract) infections: Secondary | ICD-10-CM | POA: Insufficient documentation

## 2024-06-09 DIAGNOSIS — N390 Urinary tract infection, site not specified: Secondary | ICD-10-CM | POA: Insufficient documentation

## 2024-06-09 LAB — URINALYSIS, W/ REFLEX TO CULTURE (INFECTION SUSPECTED)
Bilirubin Urine: NEGATIVE
Glucose, UA: 100 mg/dL — AB
Ketones, ur: NEGATIVE mg/dL
Nitrite: POSITIVE — AB
RBC / HPF: >50 RBC/hpf (ref 0–5)
Specific Gravity, Urine: 1.015 (ref 1.005–1.030)
pH: 8.5 — ABNORMAL HIGH (ref 5.0–8.0)

## 2024-06-09 MED ORDER — CIPROFLOXACIN HCL 500 MG PO TABS: 500.0000 mg | ORAL_TABLET | Freq: Two times a day (BID) | ORAL | 0 refills | Status: AC

## 2024-06-09 MED ORDER — PHENAZOPYRIDINE HCL 200 MG PO TABS: 200.0000 mg | ORAL_TABLET | Freq: Three times a day (TID) | ORAL | 0 refills | Status: DC

## 2024-06-09 NOTE — Discharge Instructions (Addendum)
Take the Cipro twice daily for 7 days with food for treatment of urinary tract infection.  Use the Pyridium every 8 hours as needed for urinary discomfort.  This will turn your urine a bright red-orange.  Increase your oral fluid intake so that you increase your urine production and or flushing your urinary system.  Take an over-the-counter probiotic, such as Culturelle-Align-Activia, 1 hour after each dose of antibiotic to prevent diarrhea or yeast infections from forming.  We will culture urine and change the antibiotics if necessary.  Return for reevaluation, or see your primary care provider, for any new or worsening symptoms.

## 2024-06-09 NOTE — ED Provider Notes (Signed)
 [facility]    CSN: [id number] Arrival date & time: 06/09/24  1020      History   Chief Complaint Chief Complaint  Patient presents with   Dysuria    HPI Renee Silva is a 23 y.o. female.   HPI  13 old female with past medical history significant for UTIs and RSV presents for evaluation of ongoing UTI symptoms that have been present since Mother's Day.  She reports that she was in [facility] and she was seen in an emergency room there and diagnosed with a UTI.  She took an antibiotic 3 times a day but does not remember the name or the course duration.  She reports that her symptoms never resolved.  She has not had any vaginal itching or discharge but she has had some low back pain.  She also denies fever, nausea, or vomiting.  Past Medical History:  Diagnosis Date   History of 2019 novel coronavirus disease (COVID-19) 05/20/2019   RSV (respiratory syncytial virus infection)    UTI (urinary tract infection)     There are no active problems to display for this patient.   Past Surgical History:  Procedure Laterality Date   NO PAST SURGERIES      OB History   No obstetric history on file.      Home Medications    Prior to Admission medications   Medication Sig Start Date End Date Taking? Authorizing Provider  ciprofloxacin (CIPRO) 500 MG tablet Take 1 tablet (500 mg total) by mouth 2 (two) times daily for 7 days. 06/09/24 06/16/24 Yes Bernardino Ditch, NP  phenazopyridine (PYRIDIUM) 200 MG tablet Take 1 tablet (200 mg total) by mouth 3 (three) times daily. 06/09/24  Yes Bernardino Ditch, NP  fluticasone  (FLONASE ) 50 MCG/ACT nasal spray Place 2 sprays into both nostrils daily. 04/23/24   Bernardino Ditch, NP  ondansetron  (ZOFRAN -ODT) 4 MG disintegrating tablet Take 1 tablet (4 mg total) by mouth every 8 (eight) hours as needed for nausea or vomiting. 08/03/23   Lamptey, Aleene KIDD, MD  polyethylene glycol (MIRALAX ) 17 g packet Take 17 g by mouth daily as needed for moderate  constipation or mild constipation. 08/03/23   Lamptey, Aleene KIDD, MD  senna-docusate (SENOKOT-S) 8.6-50 MG tablet Take 1 tablet by mouth 2 (two) times daily. 08/03/23   LampteyAleene KIDD, MD  omeprazole  (PRILOSEC) 20 MG capsule Take 1 capsule (20 mg total) by mouth daily. 06/23/20 11/18/20  Cook, Jayce G, DO    Family History Family History  Problem Relation Age of Onset   Healthy Mother    Healthy Father     Social History Social History   Tobacco Use   Smoking status: Never   Smokeless tobacco: Never  Vaping Use   Vaping status: Never Used  Substance Use Topics   Alcohol use: No   Drug use: No     Allergies   Patient has no known allergies.   Review of Systems Review of Systems  Constitutional:  Negative for fever.  Genitourinary:  Positive for dysuria and urgency. Negative for hematuria, vaginal discharge and vaginal pain.  Musculoskeletal:  Positive for back pain.     Physical Exam Triage Vital Signs ED Triage Vitals  Encounter Vitals Group     BP      Girls Systolic BP Percentile      Girls Diastolic BP Percentile      Boys Systolic BP Percentile      Boys Diastolic BP Percentile  Pulse      Resp      Temp      Temp src      SpO2      Weight      Height      Head Circumference      Peak Flow      Pain Score      Pain Loc      Pain Education      Exclude from Growth Chart    No data found.  Updated Vital Signs BP 106/69 (BP Location: Left Arm)   Pulse 90   Temp 98.2 F (36.8 C) (Oral)   Resp 16   Ht 5' 2 (1.575 m)   Wt 156 lb 1.4 oz (70.8 kg)   LMP 06/08/2024 (Exact Date)   SpO2 100%   BMI 28.55 kg/m   Visual Acuity Right Eye Distance:   Left Eye Distance:   Bilateral Distance:    Right Eye Near:   Left Eye Near:    Bilateral Near:     Physical Exam Vitals and nursing note reviewed.  Constitutional:      Appearance: Normal appearance. She is not ill-appearing.  HENT:     Head: Normocephalic and atraumatic.    Cardiovascular:     Rate and Rhythm: Normal rate and regular rhythm.     Pulses: Normal pulses.     Heart sounds: Normal heart sounds. No murmur heard.    No friction rub. No gallop.  Pulmonary:     Effort: Pulmonary effort is normal.     Breath sounds: Normal breath sounds. No wheezing, rhonchi or rales.  Abdominal:     Tenderness: There is no right CVA tenderness or left CVA tenderness.   Skin:    General: Skin is warm.     Capillary Refill: Capillary refill takes less than 2 seconds.   Neurological:     General: No focal deficit present.     Mental Status: She is alert and oriented to person, place, and time.      UC Treatments / Results  Labs (all labs ordered are listed, but only abnormal results are displayed) Labs Reviewed  URINALYSIS, W/ REFLEX TO CULTURE (INFECTION SUSPECTED) - Abnormal; Notable for the following components:      Result Value   Color, Urine AMBER (*)    APPearance HAZY (*)    pH 8.5 (*)    Glucose, UA 100 (*)    Hgb urine dipstick LARGE (*)    Protein, ur TRACE (*)    Nitrite POSITIVE (*)    Leukocytes,Ua TRACE (*)    Bacteria, UA FEW (*)    All other components within normal limits  URINE CULTURE  CERVICOVAGINAL ANCILLARY ONLY    EKG   Radiology No results found.  Procedures Procedures (including critical care time)  Medications Ordered in UC Medications - No data to display  Initial Impression / Assessment and Plan / UC Course  I have reviewed the triage vital signs and the nursing notes.  Pertinent labs & imaging results that were available during my care of the patient were reviewed by me and considered in my medical decision making (see chart for details).   Patient is a pleasant, nontoxic-appearing 23 year old female presenting for evaluation of genitourinary symptoms as outlined in HPI above.  She was treated for a UTI on Mother's Day at an ER in [facility].  She completed antibiotic therapy without improvement of  symptoms.  She does not remember  the hospital and I do not have any records available to me in epic.  She is continuing to experience dysuria and urinary urgency and frequency.  She denies any vaginal discharge or itching.  She has developed low back pain but she is also currently on her menstrual cycle.  Differential diagnosis includes urinary tract infection, BV, vaginal yeast infection, pelvic floor dysfunction.  I will order urinalysis to assess for the presence of UTI as well as a vaginal cytology swab to assess for BV or yeast.  Urinalysis has amber color with hazy appearance, elevated pH of 8.5, 100 glucose, large hemoglobin, trace leukocyte esterase, nitrate positive, trace protein.  Negative for ketones.  Reflex microscopy shows skin cell contamination with 6-10 squamous epithelials, 0-5 white cells, greater than 50 RBCs, and few bacteria.  I will order a collection of urine for a urine culture.  Vaginal cytology swab is still pending.  Given that she has nitrite positive for large blood and concern that she has E. coli UTI.  Therefore, I will discharge her home on Cipro 500 mg twice daily for 7 days along with Pyridium 200 mg every 8 hours as needed for urinary discomfort.  If her cytology swab comes back positive we will treat infection as necessary.   Final Clinical Impressions(s) / UC Diagnoses   Final diagnoses:  Lower urinary tract infectious disease     Discharge Instructions      Take the Cipro twice daily for 7 days with food for treatment of urinary tract infection.  Use the Pyridium every 8 hours as needed for urinary discomfort.  This will turn your urine a bright red-orange.  Increase your oral fluid intake so that you increase your urine production and or flushing your urinary system.  Take an over-the-counter probiotic, such as Culturelle-Align-Activia, 1 hour after each dose of antibiotic to prevent diarrhea or yeast infections from forming.  We will culture  urine and change the antibiotics if necessary.  Return for reevaluation, or see your primary care provider, for any new or worsening symptoms.      ED Prescriptions     Medication Sig Dispense Auth. Provider   ciprofloxacin (CIPRO) 500 MG tablet Take 1 tablet (500 mg total) by mouth 2 (two) times daily for 7 days. 14 tablet Bernardino Ditch, NP   phenazopyridine (PYRIDIUM) 200 MG tablet Take 1 tablet (200 mg total) by mouth 3 (three) times daily. 6 tablet Bernardino Ditch, NP      PDMP not reviewed this encounter.   Bernardino Ditch, NP 06/09/24 1123

## 2024-06-09 NOTE — ED Triage Notes (Signed)
 Pt c/o dysuria, urinary frequency. She states it started back in May. [place]. Denies fever.

## 2024-06-10 ENCOUNTER — Ambulatory Visit (HOSPITAL_COMMUNITY): Payer: Self-pay

## 2024-06-10 LAB — URINE CULTURE
Culture: <10000 — AB
Special Requests: NORMAL

## 2024-06-11 LAB — CERVICOVAGINAL ANCILLARY ONLY
Bacterial Vaginitis (gardnerella): POSITIVE — AB
Candida Glabrata: NEGATIVE
Candida Vaginitis: NEGATIVE
Comment: NEGATIVE
Comment: NEGATIVE
Comment: NEGATIVE

## 2024-06-11 MED ORDER — METRONIDAZOLE 500 MG PO TABS: 500.0000 mg | ORAL_TABLET | Freq: Two times a day (BID) | ORAL | 0 refills | Status: AC

## 2024-06-22 DIAGNOSIS — Z419 Encounter for procedure for purposes other than remedying health state, unspecified: Secondary | ICD-10-CM | POA: Diagnosis not present

## 2024-07-04 DIAGNOSIS — Z0120 Encounter for dental examination and cleaning without abnormal findings: Secondary | ICD-10-CM | POA: Diagnosis not present

## 2024-07-19 ENCOUNTER — Ambulatory Visit

## 2024-07-23 DIAGNOSIS — Z419 Encounter for procedure for purposes other than remedying health state, unspecified: Secondary | ICD-10-CM | POA: Diagnosis not present

## 2024-07-24 NOTE — Progress Notes (Signed)
 New patient visit   Patient: Renee Silva   DOB: 2001-06-23   22 y.o. Female  MRN: [id number] Visit Date: 07/25/2024  Today's healthcare provider: Isaiah DELENA Pepper, MD   Chief Complaint  Patient presents with   New Patient (Initial Visit)    Patient is here to establish care with a new PCP, no concerns.   Subjective    Renee Silva is a 23 y.o. female who presents today as a new patient to establish care.   HPI - No concerns - Not taking any medications - Taking a prenatal vitamin and MV - Interested in learning more about birth control - Getting married in [place] in September!  Past Medical History:  Diagnosis Date   History of 2019 novel coronavirus disease (COVID-19) 05/20/2019   RSV (respiratory syncytial virus infection)    UTI (urinary tract infection)    Past Surgical History:  Procedure Laterality Date   NO PAST SURGERIES     Family Status  Relation Name Status   Mother  Alive   Father  Alive   Mat Aunt  (Not Specified)   MGM  (Not Specified)   PGF  (Not Specified)   Cousin  Other       Diabetes on both sides of the family.  No partnership data on file   Family History  Problem Relation Age of Onset   Healthy Mother    Cancer Father        Rectal   Anxiety disorder Maternal Aunt    Depression Maternal Aunt    Cancer Maternal Grandmother        Breast   Arthritis Maternal Grandmother    Prostate cancer Paternal Grandfather    Social History   Socioeconomic History   Marital status: Single    Spouse name: Not on file   Number of children: Not on file   Years of education: Not on file   Highest education level: Not on file  Occupational History   Not on file  Tobacco Use   Smoking status: Never   Smokeless tobacco: Never  Vaping Use   Vaping status: Never Used  Substance and Sexual Activity   Alcohol use: No   Drug use: No   Sexual activity: Never  Other Topics Concern   Not on file  Social History Narrative   Not on  file   Social Drivers of Health   Financial Resource Strain: Low Risk  (07/25/2024)   Overall Financial Resource Strain (CARDIA)    Difficulty of Paying Living Expenses: Not hard at all  Food Insecurity: No Food Insecurity (07/25/2024)   Hunger Vital Sign    Worried About Running Out of Food in the Last Year: Never true    Ran Out of Food in the Last Year: Never true  Transportation Needs: No Transportation Needs (07/25/2024)   PRAPARE - Administrator, Civil Service (Medical): No    Lack of Transportation (Non-Medical): No  Physical Activity: Sufficiently Active (07/25/2024)   Exercise Vital Sign    Days of Exercise per Week: 7 days    Minutes of Exercise per Session: 150+ min  Stress: No Stress Concern Present (07/25/2024)   [employer] of Occupational Health - Occupational Stress Questionnaire    Feeling of Stress: Not at all  Social Connections: Moderately Integrated (07/25/2024)   Social Connection and Isolation Panel    Frequency of Communication with Friends and Family: More than three times a week  Frequency of Social Gatherings with Friends and Family: More than three times a week    Attends Religious Services: More than 4 times per year    Active Member of Clubs or Organizations: Yes    Attends Engineer, structural: More than 4 times per year    Marital Status: Never married   Outpatient Medications Prior to Visit  Medication Sig   [DISCONTINUED] fluticasone  (FLONASE ) 50 MCG/ACT nasal spray Place 2 sprays into both nostrils daily.   [DISCONTINUED] ondansetron  (ZOFRAN -ODT) 4 MG disintegrating tablet Take 1 tablet (4 mg total) by mouth every 8 (eight) hours as needed for nausea or vomiting.   [DISCONTINUED] phenazopyridine  (PYRIDIUM ) 200 MG tablet Take 1 tablet (200 mg total) by mouth 3 (three) times daily.   [DISCONTINUED] polyethylene glycol (MIRALAX ) 17 g packet Take 17 g by mouth daily as needed for moderate constipation or mild constipation.    [DISCONTINUED] senna-docusate (SENOKOT-S) 8.6-50 MG tablet Take 1 tablet by mouth 2 (two) times daily.   No facility-administered medications prior to visit.   No Known Allergies  Reviews of Systems as noted in HPI.       Objective    BP (!) 104/55 (BP Location: Left Arm, Patient Position: Sitting, Cuff Size: Normal)   Pulse 83   Temp 98.5 F (36.9 C) (Oral)   Ht 5' 3 (1.6 m)   Wt 183 lb 9.6 oz (83.3 kg)   SpO2 100%   BMI 32.52 kg/m      Physical Exam Constitutional:      Appearance: Normal appearance.  HENT:     Head: Normocephalic and atraumatic.     Mouth/Throat:     Mouth: Mucous membranes are moist.  Eyes:     Pupils: Pupils are equal, round, and reactive to light.  Cardiovascular:     Rate and Rhythm: Normal rate and regular rhythm.     Heart sounds: Normal heart sounds.  Pulmonary:     Effort: Pulmonary effort is normal.     Breath sounds: Normal breath sounds.  Skin:    General: Skin is warm.  Neurological:     General: No focal deficit present.     Mental Status: She is alert.    Depression Screen    07/25/2024    2:40 PM  PHQ 2/9 Scores  PHQ - 2 Score 0  PHQ- 9 Score 3   No results found for any visits on 07/25/24.  Assessment & Plan      Problem List Items Addressed This Visit       Other   Encounter to establish care - Primary   Patient here to establish care, no concerns. Reviewed medical hx, family hx, social hx. Declined STD screening. UTD on vaccines.      General counseling and advice on contraceptive management   Patient interested in learning more about birth control methods. Current method is abstinence. Discussed LARCs, OCPs. Patient is interested in OCPs, but would like to think about it. Gave resource of bedsider.org.      Family hx of colon cancer   Patient's father diagnosed with colon cancer around age ~48. Recommend colon cancer screening around age 92.         No follow-ups on file.      Isaiah DELENA Pepper, MD   [facility] [phone] (phone) [phone] (fax)

## 2024-07-25 ENCOUNTER — Ambulatory Visit

## 2024-07-25 VITALS — BP 104/55 | HR 83 | Temp 98.5°F | Ht 63.0 in | Wt 183.6 lb

## 2024-07-25 DIAGNOSIS — Z3009 Encounter for other general counseling and advice on contraception: Secondary | ICD-10-CM | POA: Diagnosis not present

## 2024-07-25 DIAGNOSIS — Z800 Family history of malignant neoplasm of digestive organs: Secondary | ICD-10-CM

## 2024-07-25 DIAGNOSIS — Z7689 Persons encountering health services in other specified circumstances: Secondary | ICD-10-CM | POA: Insufficient documentation

## 2024-07-25 DIAGNOSIS — Z23 Encounter for immunization: Secondary | ICD-10-CM

## 2024-07-25 NOTE — Assessment & Plan Note (Signed)
 Patient's father diagnosed with colon cancer around age ~48. Recommend colon cancer screening around age 23.

## 2024-07-25 NOTE — Patient Instructions (Addendum)
 There are many types of birth control pills. You could consider starting Yaz.   I would also recommend [email or web address]

## 2024-07-25 NOTE — Assessment & Plan Note (Addendum)
 Patient here to establish care, no concerns. Reviewed medical hx, family hx, social hx. Declined STD screening. UTD on vaccines.

## 2024-07-25 NOTE — Assessment & Plan Note (Signed)
 Patient interested in learning more about birth control methods. Current method is abstinence. Discussed LARCs, OCPs. Patient is interested in OCPs, but would like to think about it. Gave resource of [email or web address].

## 2024-07-31 DIAGNOSIS — K029 Dental caries, unspecified: Secondary | ICD-10-CM | POA: Diagnosis not present

## 2024-08-23 DIAGNOSIS — Z419 Encounter for procedure for purposes other than remedying health state, unspecified: Secondary | ICD-10-CM | POA: Diagnosis not present

## 2024-09-01 ENCOUNTER — Ambulatory Visit
Admission: EM | Admit: 2024-09-01 | Discharge: 2024-09-01 | Disposition: A | Discharge status: Home / Self Care | Attending: Emergency Medicine | Admitting: Emergency Medicine

## 2024-09-01 ENCOUNTER — Encounter: Payer: Self-pay | Admitting: Emergency Medicine

## 2024-09-01 DIAGNOSIS — H6692 Otitis media, unspecified, left ear: Secondary | ICD-10-CM

## 2024-09-01 DIAGNOSIS — H60503 Unspecified acute noninfective otitis externa, bilateral: Secondary | ICD-10-CM

## 2024-09-01 MED ORDER — AMOXICILLIN 875 MG PO TABS: 875.0000 mg | ORAL_TABLET | Freq: Two times a day (BID) | ORAL | 0 refills | Status: AC

## 2024-09-01 MED ORDER — OFLOXACIN 0.3 % OT SOLN: 10.0000 [drp] | Freq: Every day | OTIC | 0 refills | Status: DC

## 2024-09-01 NOTE — ED Triage Notes (Signed)
 Provider triage

## 2024-09-01 NOTE — ED Provider Notes (Signed)
 Renee Silva    CSN: [id number] Arrival date & time: 09/01/24  1219      History   Chief Complaint Chief Complaint  Patient presents with   Otalgia    HPI Renee Silva is a 23 y.o. female.  Patient presents with left ear pain and drainage x 4 days.  She recently returned from her honeymoon and was swimming.  She reports history of swimmer's ear.  She denies fever, chills, sore throat, cough, shortness of breath, vomiting, diarrhea.  No OTC medications taken today.  LMP 08/26/2024.  The history is provided by the patient and medical records.    Past Medical History:  Diagnosis Date   History of 2019 novel coronavirus disease (COVID-19) 05/20/2019   RSV (respiratory syncytial virus infection)    UTI (urinary tract infection)     Patient Active Problem List   Diagnosis Date Noted   Encounter to establish care 07/25/2024   General counseling and advice on contraceptive management 07/25/2024   Family hx of colon cancer 07/25/2024   Obesity 12/14/2020    Past Surgical History:  Procedure Laterality Date   NO PAST SURGERIES      OB History   No obstetric history on file.      Home Medications    Prior to Admission medications   Medication Sig Start Date End Date Taking? Authorizing Provider  amoxicillin  (AMOXIL ) 875 MG tablet Take 1 tablet (875 mg total) by mouth 2 (two) times daily for 10 days. 09/01/24 09/11/24 Yes Corlis Burnard DEL, NP  ofloxacin  (FLOXIN ) 0.3 % OTIC solution Place 10 drops into both ears daily. 09/01/24  Yes Corlis Burnard DEL, NP  omeprazole  (PRILOSEC) 20 MG capsule Take 1 capsule (20 mg total) by mouth daily. 06/23/20 11/18/20  Cook, Jayce G, DO    Family History Family History  Problem Relation Age of Onset   Healthy Mother    Cancer Father        Rectal   Anxiety disorder Maternal Aunt    Depression Maternal Aunt    Cancer Maternal Grandmother        Breast   Arthritis Maternal Grandmother    Prostate cancer Paternal Grandfather      Social History Social History   Tobacco Use   Smoking status: Never   Smokeless tobacco: Never  Vaping Use   Vaping status: Never Used  Substance Use Topics   Alcohol use: No   Drug use: No     Allergies   Patient has no known allergies.   Review of Systems Review of Systems  Constitutional:  Negative for chills and fever.  HENT:  Positive for ear discharge and ear pain. Negative for sore throat.   Respiratory:  Negative for cough and shortness of breath.   Gastrointestinal:  Negative for diarrhea and vomiting.     Physical Exam Triage Vital Signs ED Triage Vitals  Encounter Vitals Group     BP      Girls Systolic BP Percentile      Girls Diastolic BP Percentile      Boys Systolic BP Percentile      Boys Diastolic BP Percentile      Pulse      Resp      Temp      Temp src      SpO2      Weight      Height      Head Circumference      Peak Flow  Pain Score      Pain Loc      Pain Education      Exclude from Growth Chart    No data found.  Updated Vital Signs BP 95/68 (BP Location: Right Arm)   Pulse 71   Temp 98.5 F (36.9 C) (Oral)   Resp 16   SpO2 97%   Visual Acuity Right Eye Distance:   Left Eye Distance:   Bilateral Distance:    Right Eye Near:   Left Eye Near:    Bilateral Near:     Physical Exam Constitutional:      General: She is not in acute distress. HENT:     Right Ear: Tympanic membrane normal.     Left Ear: Tenderness present. Tympanic membrane is erythematous.     Ears:     Comments: Both ear canals erythematous.    Nose: Nose normal.     Mouth/Throat:     Mouth: Mucous membranes are moist.     Pharynx: Oropharynx is clear.  Cardiovascular:     Rate and Rhythm: Normal rate and regular rhythm.     Heart sounds: Normal heart sounds.  Pulmonary:     Effort: Pulmonary effort is normal. No respiratory distress.     Breath sounds: Normal breath sounds.  Neurological:     Mental Status: She is alert.      UC  Treatments / Results  Labs (all labs ordered are listed, but only abnormal results are displayed) Labs Reviewed - No data to display  EKG   Radiology No results found.  Procedures Procedures (including critical care time)  Medications Ordered in UC Medications - No data to display  Initial Impression / Assessment and Plan / UC Course  I have reviewed the triage vital signs and the nursing notes.  Pertinent labs & imaging results that were available during my care of the patient were reviewed by me and considered in my medical decision making (see chart for details).    Left otitis media, bilateral otitis externa.  Afebrile and vital signs are stable.  Treating otitis media with amoxicillin .  Treating otitis externa with ofloxacin  eardrops.  Education provided on otitis media and otitis externa.  Instructed patient to follow-up with her PCP if she is not improving.  She agrees to plan of care.  Final Clinical Impressions(s) / UC Diagnoses   Final diagnoses:  Left otitis media, unspecified otitis media type  Acute otitis externa of both ears, unspecified type     Discharge Instructions      Take the amoxicillin  as directed.  Use the antibiotic eardrops as directed.  Follow-up with your primary care provider if you are not improving.     ED Prescriptions     Medication Sig Dispense Auth. Provider   amoxicillin  (AMOXIL ) 875 MG tablet Take 1 tablet (875 mg total) by mouth 2 (two) times daily for 10 days. 20 tablet Corlis Burnard DEL, NP   ofloxacin  (FLOXIN ) 0.3 % OTIC solution Place 10 drops into both ears daily. 5 mL Corlis Burnard DEL, NP      PDMP not reviewed this encounter.   Corlis Burnard DEL, NP 09/01/24 [phone]

## 2024-09-01 NOTE — Discharge Instructions (Signed)
 Take the amoxicillin  as directed.  Use the antibiotic eardrops as directed.  Follow-up with your primary care provider if you are not improving.

## 2024-09-11 ENCOUNTER — Telehealth: Payer: Self-pay

## 2024-09-11 NOTE — Telephone Encounter (Unsigned)
 Copied from CRM [id number]. Topic: Clinical - Request for Lab/Test Order >> Sep 11, 2024 10:24 AM Carlatta H wrote: Reason for CRM: Patient has been feeling off and weak//She would like to have her glucose levels checked//Please call patient when orders are in chart//

## 2024-09-11 NOTE — Telephone Encounter (Unsigned)
 Copied from CRM [id number]. Topic: Clinical - Medical Advice >> Sep 11, 2024 10:26 AM Renee Silva wrote: Reason for CRM: Patient would like a message back regarding weight loss option

## 2024-09-12 NOTE — Telephone Encounter (Signed)
 Appt scheduled for this and to discuss weight loss as mentioned in another telephone encounter.

## 2024-09-12 NOTE — Telephone Encounter (Signed)
 Appt scheduled

## 2024-09-12 NOTE — Telephone Encounter (Signed)
 Please call and schedule patient an appt to be seen to discuss weight loss.

## 2024-09-17 ENCOUNTER — Ambulatory Visit (INDEPENDENT_AMBULATORY_CARE_PROVIDER_SITE_OTHER)

## 2024-09-17 VITALS — BP 98/62 | HR 92 | Resp 16 | Ht 63.0 in | Wt 189.2 lb

## 2024-09-17 DIAGNOSIS — R3589 Other polyuria: Secondary | ICD-10-CM | POA: Insufficient documentation

## 2024-09-17 LAB — POCT GLYCOSYLATED HEMOGLOBIN (HGB A1C): Hemoglobin A1C: 4.8 % (ref 4.0–5.6)

## 2024-09-17 LAB — POCT URINE PREGNANCY: Preg Test, Ur: NEGATIVE

## 2024-09-17 NOTE — Assessment & Plan Note (Signed)
 Patent with 2 week hx of polyuria. Patient has been following a low calorie diet, otherwise no dietary or medication changes. Denies dysuria, hematuria. POC A1c was normal. POC pregnancy was negative. DDx includes electrolyte disturbance, thyroid  dysfunction, and primary polydipsia. - Will obtain TSH and CMP as below - Discussed reducing water intake, especially before bed - Return precautions given

## 2024-09-17 NOTE — Progress Notes (Signed)
 Established patient visit  Patient: Renee Silva   DOB: 2001-08-15   23 y.o. Female  MRN: [id number] Visit Date: 09/17/2024  Today's healthcare provider: Isaiah DELENA Pepper, MD   Chief Complaint  Patient presents with   Follow-up    Wt loss, symptoms of DM( family history)   Subjective    HPI  Polyuria - reports increased urination - denies any new supplements, medications, vitamins - denies dysuria, hematuria - has been cutting back on sugary drinks - has been reducing her caloric intake recently  - has been drinking more water, has been feeling more thirsty - has been sexually active, last LMP in September   Medications: Outpatient Medications Prior to Visit  Medication Sig   [DISCONTINUED] ofloxacin  (FLOXIN ) 0.3 % OTIC solution Place 10 drops into both ears daily.   No facility-administered medications prior to visit.    Review of Systems as noted in HPI.      Objective    BP 98/62 (BP Location: Right Arm, Patient Position: Sitting, Cuff Size: Normal)   Pulse 92   Resp 16   Ht 5' 3 (1.6 m)   Wt 189 lb 3.2 oz (85.8 kg)   LMP 08/23/2024 (Exact Date)   SpO2 100%   BMI 33.52 kg/m    Physical Exam Constitutional:      Appearance: Normal appearance.  HENT:     Head: Normocephalic and atraumatic.     Mouth/Throat:     Mouth: Mucous membranes are moist.  Eyes:     Pupils: Pupils are equal, round, and reactive to light.  Pulmonary:     Effort: Pulmonary effort is normal.  Skin:    General: Skin is warm.  Neurological:     General: No focal deficit present.     Mental Status: She is alert.     BP Readings from Last 3 Encounters:  09/17/24 98/62  09/01/24 95/68  07/25/24 (!) 104/55   Wt Readings from Last 3 Encounters:  09/17/24 189 lb 3.2 oz (85.8 kg)  07/25/24 183 lb 9.6 oz (83.3 kg)  06/09/24 156 lb 1.4 oz (70.8 kg)     Results for orders placed or performed in visit on 09/17/24  POCT urine pregnancy  Result Value Ref Range   Preg  Test, Ur Negative Negative  POCT glycosylated hemoglobin (Hb A1C)  Result Value Ref Range   Hemoglobin A1C 4.8 4.0 - 5.6 %   HbA1c POC (<> result, manual entry)     HbA1c, POC (prediabetic range)     HbA1c, POC (controlled diabetic range)      Assessment & Plan     Problem List Items Addressed This Visit       Other   Polyuria - Primary   Patent with 2 week hx of polyuria. Patient has been following a low calorie diet, otherwise no dietary or medication changes. Denies dysuria, hematuria. POC A1c was normal. POC pregnancy was negative. DDx includes electrolyte disturbance, thyroid  dysfunction, and primary polydipsia. - Will obtain TSH and CMP as below - Discussed reducing water intake, especially before bed - Return precautions given      Relevant Orders   TSH   Comprehensive metabolic panel with GFR   POCT urine pregnancy (Completed)   POCT glycosylated hemoglobin (Hb A1C) (Completed)    Return in about 4 weeks (around 10/15/2024) for Procedure appointment (40 minutes) - Nexplanon Insertion.       Isaiah DELENA Pepper, MD  [facility]  [phone] (phone) [phone] (fax)

## 2024-09-17 NOTE — Patient Instructions (Signed)
 I value your feedback, so if you receive a survey, please take the time to fill it out. Thank you for choosing [facility]!

## 2024-09-18 ENCOUNTER — Ambulatory Visit: Payer: Self-pay

## 2024-09-18 LAB — COMPREHENSIVE METABOLIC PANEL WITH GFR
ALT: 10 IU/L (ref 0–32)
AST: 14 IU/L (ref 0–40)
Albumin: 5 g/dL (ref 4.0–5.0)
Alkaline Phosphatase: 97 IU/L (ref 42–106)
BUN/Creatinine Ratio: 14 (ref 9–23)
BUN: 11 mg/dL (ref 6–20)
Bilirubin Total: 0.6 mg/dL (ref 0.0–1.2)
CO2: 20 mmol/L (ref 20–29)
Calcium: 9.9 mg/dL (ref 8.7–10.2)
Chloride: 104 mmol/L (ref 96–106)
Creatinine, Ser: 0.8 mg/dL (ref 0.57–1.00)
Globulin, Total: 2.1 g/dL (ref 1.5–4.5)
Glucose: 75 mg/dL (ref 70–99)
Potassium: 4.3 mmol/L (ref 3.5–5.2)
Sodium: 142 mmol/L (ref 134–144)
Total Protein: 7.1 g/dL (ref 6.0–8.5)
eGFR: 108 mL/min/1.73 (ref >59)

## 2024-09-18 LAB — TSH: TSH: 2.08 u[IU]/mL (ref 0.450–4.500)

## 2024-09-26 ENCOUNTER — Ambulatory Visit

## 2024-09-26 DIAGNOSIS — Z3201 Encounter for pregnancy test, result positive: Secondary | ICD-10-CM

## 2024-09-26 DIAGNOSIS — Z309 Encounter for contraceptive management, unspecified: Secondary | ICD-10-CM | POA: Diagnosis not present

## 2024-09-26 LAB — PREGNANCY, URINE: Preg Test, Ur: POSITIVE — AB

## 2024-09-26 MED ORDER — PRENATAL 27-0.8 MG PO TABS: 1.0000 | ORAL_TABLET | Freq: Every day | ORAL | Status: AC

## 2024-09-26 NOTE — Progress Notes (Signed)
 UPT positive .Plans on prenatal care at [facility]. To clerical for presumptive eligibility .   The patient was dispensed PNV #[id number] today. I provided counseling today regarding the medication. We discussed the medication, the side effects and when to call clinic. Patient given the opportunity to ask questions. Questions answered.

## 2024-10-14 ENCOUNTER — Telehealth: Payer: Self-pay

## 2024-10-14 NOTE — Telephone Encounter (Signed)
 Copied from CRM #[id number]. Topic: Clinical - Medication Question >> Oct 11, 2024  1:58 PM Delon DASEN wrote: Reason for CRM: Patient is pregnant and is asking for something for morning sickness, [phone]

## 2024-10-15 ENCOUNTER — Ambulatory Visit

## 2024-10-23 DIAGNOSIS — O3680X0 Pregnancy with inconclusive fetal viability, not applicable or unspecified: Secondary | ICD-10-CM | POA: Diagnosis not present

## 2024-10-23 DIAGNOSIS — Z3201 Encounter for pregnancy test, result positive: Secondary | ICD-10-CM | POA: Diagnosis not present

## 2024-11-20 ENCOUNTER — Encounter

## 2024-11-20 ENCOUNTER — Telehealth: Admitting: Physician Assistant

## 2024-11-20 DIAGNOSIS — Z5321 Procedure and treatment not carried out due to patient leaving prior to being seen by health care provider: Secondary | ICD-10-CM

## 2024-11-20 NOTE — Progress Notes (Signed)
 Patient was logged into Video but did not have camera or microphone access. Provider attempted to help get them access, but the line disconnected. The Provider sent multiple links to phone and email and patient never logged back in. Provider called patient at 2:50pm and there was no answer. Provider will mark as left without being seen and will No Charge.

## 2024-11-25 DIAGNOSIS — R87612 Low grade squamous intraepithelial lesion on cytologic smear of cervix (LGSIL): Secondary | ICD-10-CM | POA: Diagnosis not present

## 2024-11-25 DIAGNOSIS — O99210 Obesity complicating pregnancy, unspecified trimester: Secondary | ICD-10-CM | POA: Diagnosis not present

## 2024-11-25 DIAGNOSIS — Z3481 Encounter for supervision of other normal pregnancy, first trimester: Secondary | ICD-10-CM | POA: Diagnosis not present

## 2024-11-25 DIAGNOSIS — E66811 Obesity, class 1: Secondary | ICD-10-CM | POA: Diagnosis not present

## 2024-12-11 ENCOUNTER — Emergency Department

## 2024-12-11 ENCOUNTER — Other Ambulatory Visit: Payer: Self-pay

## 2024-12-11 DIAGNOSIS — O200 Threatened abortion: Secondary | ICD-10-CM | POA: Insufficient documentation

## 2024-12-11 DIAGNOSIS — R300 Dysuria: Secondary | ICD-10-CM | POA: Insufficient documentation

## 2024-12-11 DIAGNOSIS — O219 Vomiting of pregnancy, unspecified: Secondary | ICD-10-CM | POA: Insufficient documentation

## 2024-12-11 DIAGNOSIS — Z3A15 15 weeks gestation of pregnancy: Secondary | ICD-10-CM | POA: Insufficient documentation

## 2024-12-11 DIAGNOSIS — R1032 Left lower quadrant pain: Secondary | ICD-10-CM | POA: Diagnosis not present

## 2024-12-11 DIAGNOSIS — O26892 Other specified pregnancy related conditions, second trimester: Secondary | ICD-10-CM | POA: Diagnosis present

## 2024-12-11 NOTE — ED Notes (Signed)
 Unable to obtain FHTs with doppler after several attempts by this nurse and triage nurse; OB us  ordered to assess

## 2024-12-11 NOTE — ED Triage Notes (Signed)
 POV with CC of emesis and L side abd pain. Pt is [redacted] weeks pregnant - second pregnancy. Reports generalized emesis throughout pregnancies. Ambulatory with steady gait and appears to be in NAD at this time.

## 2024-12-12 ENCOUNTER — Emergency Department

## 2024-12-12 ENCOUNTER — Emergency Department
Admission: EM | Admit: 2024-12-12 | Discharge: 2024-12-12 | Disposition: A | Discharge status: Home / Self Care | Attending: Emergency Medicine | Admitting: Emergency Medicine

## 2024-12-12 DIAGNOSIS — Z3A15 15 weeks gestation of pregnancy: Secondary | ICD-10-CM | POA: Diagnosis not present

## 2024-12-12 DIAGNOSIS — O219 Vomiting of pregnancy, unspecified: Secondary | ICD-10-CM

## 2024-12-12 DIAGNOSIS — R109 Unspecified abdominal pain: Secondary | ICD-10-CM | POA: Diagnosis not present

## 2024-12-12 DIAGNOSIS — O26892 Other specified pregnancy related conditions, second trimester: Secondary | ICD-10-CM | POA: Diagnosis not present

## 2024-12-12 DIAGNOSIS — R1032 Left lower quadrant pain: Secondary | ICD-10-CM

## 2024-12-12 DIAGNOSIS — O200 Threatened abortion: Secondary | ICD-10-CM

## 2024-12-12 LAB — URINALYSIS, ROUTINE W REFLEX MICROSCOPIC
Bilirubin Urine: NEGATIVE
Glucose, UA: NEGATIVE mg/dL
Hgb urine dipstick: NEGATIVE
Ketones, ur: 5 mg/dL — AB
Leukocytes,Ua: NEGATIVE
Nitrite: NEGATIVE
Protein, ur: NEGATIVE mg/dL
Specific Gravity, Urine: 1.014 (ref 1.005–1.030)
pH: 6 (ref 5.0–8.0)

## 2024-12-12 LAB — HCG, QUANTITATIVE, PREGNANCY: hCG, Beta Chain, Quant, S: 70649 m[IU]/mL — ABNORMAL HIGH

## 2024-12-12 LAB — CBC WITH DIFFERENTIAL/PLATELET
Abs Immature Granulocytes: 0.06 K/uL (ref 0.00–0.07)
Basophils Absolute: 0 K/uL (ref 0.0–0.1)
Basophils Relative: 0 %
Eosinophils Absolute: 0.1 K/uL (ref 0.0–0.5)
Eosinophils Relative: 1 %
HCT: 33.2 % — ABNORMAL LOW (ref 36.0–46.0)
Hemoglobin: 11.8 g/dL — ABNORMAL LOW (ref 12.0–15.0)
Immature Granulocytes: 1 %
Lymphocytes Relative: 15 %
Lymphs Abs: 1.8 K/uL (ref 0.7–4.0)
MCH: 30.7 pg (ref 26.0–34.0)
MCHC: 35.5 g/dL (ref 30.0–36.0)
MCV: 86.5 fL (ref 80.0–100.0)
Monocytes Absolute: 0.6 K/uL (ref 0.1–1.0)
Monocytes Relative: 5 %
Neutro Abs: 9.7 K/uL — ABNORMAL HIGH (ref 1.7–7.7)
Neutrophils Relative %: 78 %
Platelets: 244 K/uL (ref 150–400)
RBC: 3.84 MIL/uL — ABNORMAL LOW (ref 3.87–5.11)
RDW: 12.6 % (ref 11.5–15.5)
WBC: 12.2 K/uL — ABNORMAL HIGH (ref 4.0–10.5)
nRBC: 0 % (ref 0.0–0.2)

## 2024-12-12 LAB — COMPREHENSIVE METABOLIC PANEL WITH GFR
ALT: 8 U/L (ref 0–44)
AST: 14 U/L — ABNORMAL LOW (ref 15–41)
Albumin: 4.2 g/dL (ref 3.5–5.0)
Alkaline Phosphatase: 68 U/L (ref 38–126)
Anion gap: 12 (ref 5–15)
BUN: 6 mg/dL (ref 6–20)
CO2: 22 mmol/L (ref 22–32)
Calcium: 9.2 mg/dL (ref 8.9–10.3)
Chloride: 104 mmol/L (ref 98–111)
Creatinine, Ser: 0.49 mg/dL (ref 0.44–1.00)
GFR, Estimated: >60 mL/min
Glucose, Bld: 75 mg/dL (ref 70–99)
Potassium: 4 mmol/L (ref 3.5–5.1)
Sodium: 139 mmol/L (ref 135–145)
Total Bilirubin: 0.2 mg/dL (ref 0.0–1.2)
Total Protein: 6.7 g/dL (ref 6.5–8.1)

## 2024-12-12 MED ORDER — ONDANSETRON 4 MG PO TBDP: 4.0000 mg | ORAL_TABLET | Freq: Three times a day (TID) | ORAL | 0 refills | Status: AC | PRN

## 2024-12-12 MED ORDER — ACETAMINOPHEN 500 MG PO TABS
1000.0000 mg | ORAL_TABLET | Freq: Once | ORAL | Status: AC
  Administered 2024-12-12: 1000 mg via ORAL
  Filled 2024-12-12: qty 2

## 2024-12-12 MED ORDER — SODIUM CHLORIDE 0.9 % IV BOLUS
1000.0000 mL | Freq: Once | INTRAVENOUS | Status: AC
  Administered 2024-12-12: 1000 mL via INTRAVENOUS

## 2024-12-12 MED ORDER — ONDANSETRON HCL 4 MG/2ML IJ SOLN
4.0000 mg | Freq: Once | INTRAMUSCULAR | Status: AC
  Administered 2024-12-12: 4 mg via INTRAVENOUS
  Filled 2024-12-12: qty 2

## 2024-12-12 NOTE — ED Provider Notes (Signed)
 "  [facility] Provider Note    Event Date/Time   First MD Initiated Contact with Patient 12/12/24 0004     (approximate)   History   Emesis   HPI  Renee Silva is a 24 y.o. female   Past medical history of approximately [redacted] weeks pregnant with no significant past medical history otherwise who presents emergency department with left lower crampy abdominal pain today.  Has had ongoing nausea and vomiting throughout the first trimester pregnancy, unchanged.  Mild intermittent dysuria and frequency but none today.  Denies vaginal discharge, bleeding, fluids.  Denies GI bleeding.  Bowel movements normal.  No other acute medical complaints.   External Medical Documents Reviewed: Previous outpatient gynecology notes from earlier this month      Physical Exam   Triage Vital Signs: ED Triage Vitals  Encounter Vitals Group     BP 12/11/24 2253 106/70     Girls Systolic BP Percentile --      Girls Diastolic BP Percentile --      Boys Systolic BP Percentile --      Boys Diastolic BP Percentile --      Pulse Rate 12/11/24 2253 84     Resp 12/11/24 2253 19     Temp 12/11/24 2253 98.5 F (36.9 C)     Temp Source 12/11/24 2253 Oral     SpO2 12/11/24 2253 98 %     Weight 12/11/24 2254 191 lb (86.6 kg)     Height 12/11/24 2254 5' 3 (1.6 m)     Head Circumference --      Peak Flow --      Pain Score 12/11/24 2254 7     Pain Loc --      Pain Education --      Exclude from Growth Chart --     Most recent vital signs: Vitals:   12/11/24 2253  BP: 106/70  Pulse: 84  Resp: 19  Temp: 98.5 F (36.9 C)  SpO2: 98%    General: Awake, no distress.  CV:  Good peripheral perfusion.  Resp:  Normal effort.  Abd:  No distention.  Other:  Soft none focal nonperitoneal abdominal exam.  Vital signs normal.  Appears euvolemic and nontoxic overall.   ED Results / Procedures / Treatments   Labs (all labs ordered are listed, but only abnormal results are  displayed) Labs Reviewed  CBC WITH DIFFERENTIAL/PLATELET - Abnormal; Notable for the following components:      Result Value   WBC 12.2 (*)    RBC 3.84 (*)    Hemoglobin 11.8 (*)    HCT 33.2 (*)    Neutro Abs 9.7 (*)    All other components within normal limits  COMPREHENSIVE METABOLIC PANEL WITH GFR - Abnormal; Notable for the following components:   AST 14 (*)    All other components within normal limits  HCG, QUANTITATIVE, PREGNANCY - Abnormal; Notable for the following components:   hCG, Beta Chain, Quant, S 70,649 (*)    All other components within normal limits  URINALYSIS, ROUTINE W REFLEX MICROSCOPIC - Abnormal; Notable for the following components:   Color, Urine YELLOW (*)    APPearance CLEAR (*)    Ketones, ur 5 (*)    All other components within normal limits     I ordered and reviewed the above labs they are notable for high blood cell count mildly elevated 12.2.   RADIOLOGY I independently reviewed and interpreted pelvic ultrasound and  see an intrauterine pregnancy I also reviewed radiologist's formal read.   PROCEDURES:  Critical Care performed: No  Procedures   MEDICATIONS ORDERED IN ED: Medications  sodium chloride  0.9 % bolus 1,000 mL (0 mLs Intravenous Stopped 12/12/24 0150)  ondansetron  (ZOFRAN ) injection 4 mg (4 mg Intravenous Given 12/12/24 0016)  acetaminophen  (TYLENOL ) tablet 1,000 mg (1,000 mg Oral Given 12/12/24 0102)    IMPRESSION / MDM / ASSESSMENT AND PLAN / ED COURSE  I reviewed the triage vital signs and the nursing notes.                                Patient's presentation is most consistent with acute presentation with potential threat to life or bodily function.  Differential diagnosis includes, but is not limited to, threatened miscarriage, ectopic pregnancy, diverticulitis, ovarian torsion or cyst, considered but less likely other intra-abdominal emergencies like appendicitis, obstruction, biliary pathology    MDM:     Pregnant patient with ongoing nausea and vomiting pregnancy unchanged with normal electrolytes and well-hydrated appearing.  Will give fluids and Zofran .  Regarding left lower quadrant crampy abdominal pain, may represent a miscarriage but fortunately ultrasounds appear normal today.  No evidence of ovarian pathology as well.  Benign abdominal exam otherwise rules against surgical abdominal pathologies.  I considered hospitalization for admission or observation however given her stability, mild symptoms, unremarkable evaluation as above, and access to very close follow-up, I think she can be discharged at this time and follow-up with her gynecologist as soon as possible.  Return precautions given.        FINAL CLINICAL IMPRESSION(S) / ED DIAGNOSES   Final diagnoses:  Left lower quadrant pain  Nausea/vomiting in pregnancy  Threatened miscarriage in early pregnancy     Rx / DC Orders   ED Discharge Orders          Ordered    ondansetron  (ZOFRAN -ODT) 4 MG disintegrating tablet  Every 8 hours PRN        12/12/24 0233             Note:  This document was prepared using Dragon voice recognition software and may include unintentional dictation errors.    Cyrena Mylar, MD 12/12/24 [phone]  "

## 2024-12-12 NOTE — Discharge Instructions (Signed)
 Fortunately your evaluation in the emergency department did not show any emergency conditions that account for your nausea, vomiting, and abdominal pain.  The ultrasound showed a normal pregnancy.  Given your crampy pain, it is very important that you follow-up with your obstetrics/gynecologist for follow-up appointment soon as possible for ongoing pregnancy monitoring. The ultrasound showed no ovarian problems.  Your blood test did not show any marked abnormalities.  Take Zofran  as needed for nausea and vomiting. Drink plenty of fluids to stay well-hydrated.  Find Pedialyte or similar electrolyte rehydration formulas at your local pharmacy.
